# Patient Record
Sex: Female | Born: 1970 | Race: White | Hispanic: No | Smoking: Current every day smoker
Health system: Southern US, Community
[De-identification: ages and names within clinical notes are randomized; demographics above are authoritative.]

## PROBLEM LIST (undated history)

## (undated) DIAGNOSIS — F32A Depression, unspecified: Secondary | ICD-10-CM

## (undated) DIAGNOSIS — G8929 Other chronic pain: Secondary | ICD-10-CM

## (undated) DIAGNOSIS — D649 Anemia, unspecified: Secondary | ICD-10-CM

## (undated) DIAGNOSIS — T7840XA Allergy, unspecified, initial encounter: Secondary | ICD-10-CM

## (undated) DIAGNOSIS — M1990 Unspecified osteoarthritis, unspecified site: Secondary | ICD-10-CM

## (undated) DIAGNOSIS — R51 Headache: Secondary | ICD-10-CM

## (undated) DIAGNOSIS — R519 Headache, unspecified: Secondary | ICD-10-CM

## (undated) DIAGNOSIS — K219 Gastro-esophageal reflux disease without esophagitis: Secondary | ICD-10-CM

## (undated) DIAGNOSIS — F419 Anxiety disorder, unspecified: Secondary | ICD-10-CM

## (undated) HISTORY — DX: Depression, unspecified: F32.A

## (undated) HISTORY — DX: Unspecified osteoarthritis, unspecified site: M19.90

## (undated) HISTORY — DX: Allergy, unspecified, initial encounter: T78.40XA

## (undated) HISTORY — DX: Headache, unspecified: R51.9

## (undated) HISTORY — PX: BACK SURGERY: SHX140

## (undated) HISTORY — DX: Anxiety disorder, unspecified: F41.9

## (undated) HISTORY — DX: Gastro-esophageal reflux disease without esophagitis: K21.9

## (undated) HISTORY — DX: Headache: R51

## (undated) HISTORY — DX: Other chronic pain: G89.29

## (undated) HISTORY — DX: Anemia, unspecified: D64.9

---
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
## 1994-11-06 HISTORY — PX: CARPAL TUNNEL RELEASE: SHX101

## 1998-12-20 ENCOUNTER — Other Ambulatory Visit: Admission: RE | Admit: 1998-12-20 | Discharge: 1998-12-20 | Payer: Self-pay | Admitting: Obstetrics & Gynecology

## 1999-02-28 ENCOUNTER — Other Ambulatory Visit: Admission: RE | Admit: 1999-02-28 | Discharge: 1999-02-28 | Payer: Self-pay | Admitting: *Deleted

## 1999-05-04 ENCOUNTER — Emergency Department (HOSPITAL_COMMUNITY): Admission: EM | Admit: 1999-05-04 | Discharge: 1999-05-04 | Payer: Self-pay | Admitting: Emergency Medicine

## 1999-05-19 ENCOUNTER — Encounter (HOSPITAL_COMMUNITY): Admission: RE | Admit: 1999-05-19 | Discharge: 1999-06-03 | Payer: Self-pay | Admitting: Emergency Medicine

## 1999-06-06 ENCOUNTER — Other Ambulatory Visit: Admission: RE | Admit: 1999-06-06 | Discharge: 1999-06-06 | Payer: Self-pay | Admitting: *Deleted

## 1999-11-07 HISTORY — PX: MICRODISCECTOMY LUMBAR: SUR864

## 2000-09-26 ENCOUNTER — Encounter: Payer: Self-pay | Admitting: Family Medicine

## 2000-09-26 ENCOUNTER — Encounter: Admission: RE | Admit: 2000-09-26 | Discharge: 2000-09-26 | Payer: Self-pay | Admitting: Family Medicine

## 2000-10-26 ENCOUNTER — Ambulatory Visit (HOSPITAL_COMMUNITY): Admission: RE | Admit: 2000-10-26 | Discharge: 2000-10-26 | Payer: Self-pay | Admitting: Neurosurgery

## 2000-11-12 ENCOUNTER — Ambulatory Visit (HOSPITAL_COMMUNITY): Admission: RE | Admit: 2000-11-12 | Discharge: 2000-11-12 | Payer: Self-pay | Admitting: Neurosurgery

## 2000-11-26 ENCOUNTER — Ambulatory Visit (HOSPITAL_COMMUNITY): Admission: RE | Admit: 2000-11-26 | Discharge: 2000-11-26 | Payer: Self-pay | Admitting: Neurosurgery

## 2001-02-20 ENCOUNTER — Ambulatory Visit (HOSPITAL_COMMUNITY): Admission: RE | Admit: 2001-02-20 | Discharge: 2001-02-21 | Payer: Self-pay | Admitting: Neurosurgery

## 2001-04-13 ENCOUNTER — Ambulatory Visit (HOSPITAL_COMMUNITY): Admission: RE | Admit: 2001-04-13 | Discharge: 2001-04-13 | Payer: Self-pay | Admitting: Neurosurgery

## 2001-04-18 ENCOUNTER — Encounter: Admission: RE | Admit: 2001-04-18 | Discharge: 2001-05-07 | Payer: Self-pay | Admitting: Neurosurgery

## 2007-02-04 ENCOUNTER — Emergency Department (HOSPITAL_COMMUNITY): Admission: EM | Admit: 2007-02-04 | Discharge: 2007-02-04 | Payer: Self-pay | Admitting: Emergency Medicine

## 2009-09-22 ENCOUNTER — Ambulatory Visit: Payer: Self-pay | Admitting: Diagnostic Radiology

## 2009-09-22 ENCOUNTER — Emergency Department (HOSPITAL_BASED_OUTPATIENT_CLINIC_OR_DEPARTMENT_OTHER): Admission: EM | Admit: 2009-09-22 | Discharge: 2009-09-22 | Payer: Self-pay | Admitting: Emergency Medicine

## 2009-11-06 HISTORY — PX: LUMBAR FUSION: SHX111

## 2010-04-15 ENCOUNTER — Inpatient Hospital Stay (HOSPITAL_COMMUNITY): Admission: RE | Admit: 2010-04-15 | Discharge: 2010-04-17 | Payer: Self-pay | Admitting: Neurosurgery

## 2010-05-31 ENCOUNTER — Encounter: Admission: RE | Admit: 2010-05-31 | Discharge: 2010-05-31 | Payer: Self-pay | Admitting: Neurosurgery

## 2010-07-13 ENCOUNTER — Encounter: Admission: RE | Admit: 2010-07-13 | Discharge: 2010-07-13 | Payer: Self-pay | Admitting: Neurosurgery

## 2010-07-27 ENCOUNTER — Encounter
Admission: RE | Admit: 2010-07-27 | Discharge: 2010-09-05 | Payer: Self-pay | Source: Home / Self Care | Admitting: Neurosurgery

## 2011-01-23 LAB — TYPE AND SCREEN
ABO/RH(D): A POS
Antibody Screen: NEGATIVE

## 2011-01-23 LAB — CBC
Hemoglobin: 13.1 g/dL (ref 12.0–15.0)
MCHC: 33.5 g/dL (ref 30.0–36.0)
MCV: 89.3 fL (ref 78.0–100.0)
Platelets: 164 10*3/uL (ref 150–400)
WBC: 8.7 10*3/uL (ref 4.0–10.5)

## 2011-01-23 LAB — ABO/RH: ABO/RH(D): A POS

## 2011-01-23 LAB — SURGICAL PCR SCREEN: MRSA, PCR: NEGATIVE

## 2011-03-24 NOTE — H&P (Signed)
[place]  Patient:    JERICA, CREEGAN                     MRN: [id number] Adm. Date:  [id number] Disc. Date: [id number] Attending:  Tressie Stalker D                         History and Physical  CHIEF COMPLAINT: Back pain and right leg pain.  HISTORY OF PRESENT ILLNESS: The patient is a 40 year old white female who has suffered from an approximate six month history of back and right leg pain. She failed medical management and was treated with various medications, steroid injections, etc., and she failed to improve.  She therefore weighed the risks and benefits and alternatives of surgery and decided to proceed with surgery.  She complains mainly of pain which begins in her right hip and buttocks region and runs down her right leg, and some pain in her back.  She occasionally gets some left leg pain.  PAST MEDICAL HISTORY:  1. Cervical dysplasia.  2. Remote history of appendicitis.  3. Carpal tunnel syndrome.  4. Chronic sinus trouble.  PAST SURGICAL HISTORY:  1. Appendectomy in April 1993.  2. Left carpel tunnel release in January 1997.  CURRENT MEDICATIONS: Tylox p.r.n.  ALLERGIES: SULFA causes nausea.  FAMILY HISTORY: The patients mother is age 53 and in good health.  The patients father is age 62 and in good health except for a detached retina. She has a grandmother who had breast cancer and a grandfather with high blood pressure.  SOCIAL HISTORY: The patient is divorced and has a 60-1/2-year-old daughter. [place]  She is employed as a Production assistant, radio at [employer].  She smokes one pack per day of cigarettes and has for 13 years.  I highly advised her to quit.  She denies drug use.  She drinks alcohol socially.  REVIEW OF SYSTEMS: Negative except as above.  PHYSICAL EXAMINATION:  GENERAL: Pleasant, well-developed, well-nourished 40 year old white female complaining of back and right leg  pain.  VITAL SIGNS: Height 5 feet 4 inches.  Weight 135 pounds.  HEENT: Normal.  NECK: Normal.  THORAX: Symmetric.  LUNGS: Clear to auscultation.  HEART: Regular rate and rhythm.  ABDOMEN: Soft, nontender.  EXTREMITIES: She has a well-healed scar on the right calf; otherwise normal.  BACK: No point tenderness or deformities.  NEUROLOGIC: Straight leg raise testing is equivocal on the right and negative on the left.  Fabere testing is negative bilaterally.  The patient is alert and oriented x 3.  Cranial nerves 2-12 are grossly intact bilaterally.  Vision and hearing are grossly normal bilaterally.  Motor strength is 5/5 in bilateral deltoid, biceps, triceps, hand grip, wrist extensor, interosseous psoas, quadriceps, gastrocnemius, and extensor hallucis longus, although she does give away in her right extensor hallucis longus.  Sensory examination demonstrates decreased sensation in the right foot diffusely.  Deep tendon reflexes are 2/4 in her bilateral biceps, triceps, brachial radialis, quadriceps, and gastrocnemius.  She has bilateral flexor and plantar reflexes. Negative clonus.  IMAGING STUDIES: The patient had a lumbar MRI performed at [facility] on September 26, 2000 without contrast demonstrating normal lumbar lordosis.  She has degenerative disk disease at L4-5, with loss of disk space height, mild disk desiccation, as well as central and right-sided herniated nucleus pulposus causing right greater than left lateral recess stenosis.  ASSESSMENT/PLAN: L4-5 degenerative disk disease  with herniated nucleus pulposus, spinal stenosis, lumbar radiculopathy, with lumbago.  I discussed the situation with the patient and reviewed her MRI scan with her, pointing out the abnormalities.  She has failed medical management including lumbar epidural steroid injections, time, rest, medications, etc., and I therefore discussed her current options of continued medical management, doing  nothing, and surgery.  I described the procedure of L4-5 microdiskectomy and showed her surgical models, and discussed the risks of surgery extensively.  The patient has weighed the risks and benefits and alternatives of surgery and wishes to proceed with right L4-5 microdiskectomy on February 20, 2001. DD:  02/20/01 TD:  02/21/01 Job: [id number]

## 2011-03-24 NOTE — Op Note (Signed)
[place]  Patient:    Carly Wilson, Carly Wilson                     MRN: [id number] Proc. Date: 02/20/01 Adm. Date:  [id number] Disc. Date: [id number] Attending:  Tressie Stalker D                           Operative Report  PREOPERATIVE DIAGNOSIS:  L4-5 herniated nucleus pulposus, spinal stenosis, degenerative disk disease, lumbar radiculopathy, lumbago.  POSTOPERATIVE DIAGNOSIS:  L4-5 herniated nucleus pulposus, spinal stenosis, degenerative disk disease, lumbar radiculopathy, lumbago.  OPERATION PERFORMED:  L4-5 microdiskectomy using microdissection.  SURGEON:  Cristi Loron, M.D.  ASSISTANT:  Mena Goes. Franky Macho, M.D.  ANESTHESIA:  General endotracheal.  ESTIMATED BLOOD LOSS:  Less than 100 cc.  SPECIMENS:  None.  DRAINS:  None.  COMPLICATIONS:  None.  INDICATIONS FOR PROCEDURE:   The patient is a 40 year old white female who suffered from back and right leg pain.  She had failed medical management and was worked up as an outpatient with a lumbar MRI that demonstrated herniated disk at L4-5.  The patients signs, symptoms and physical exam were consistent with a right L5 radiculopathy.  She therefore weighed the risks, benefits and alternatives to surgery and decided to proceed with microdiskectomy.  DESCRIPTION OF PROCEDURE:  The patient was brought to the operating room by the anesthesia team.  General endotracheal anesthesia was induced.  The patient was then turned to the prone position on the Wilson frame.  Her lumbosacral region was then prepared with Betadine scrub and Betadine solution.  Sterile drapes were applied.  I then injected the area to be incised with Marcaine with epinephrine solution and used the scalpel to make a vertical midline incision over the L4-5 interspace.  I used electrocautery to dissect down to the thoracolumbar fascia and divide it to the right of midline performing a right-sided subperiosteal dissection,  stripping the paraspinous musculature from the spinous process and lamina of L4 and L5.  I inserted the McCullough retractor for exposure and obtained intraoperative radiograph to confirm my location.  I then brought the operative microscope into the field and under its magnification and illumination, I completed the microdissection/decompression.  I used a high speed drill to perform a right L4 laminotomy, a wanding laminotomy with the Kerrison punch, removing the right L4-5 ligamentum flavum.  I performed a foraminotomy about the right L5 nerve roots.  I then used microdissection to free up  the nerve root from the epidural tissue and gently retracted medially with the DErrico retractor. This exposed a moderate sized disk  herniation.  I incised into the intervertebral disk with a 15 blade scalpel and performed a partial intervertebral diskectomy with a pituitary forceps, Epstein and Scoville curets.  After I was satisfied with the diskectomy, I palpated along the ventral surface of the thecal sac and the L5 nerve root and noted that the neural structures were well decompressed.  I then achieved stringent hemostasis with bipolar electrocautery.  I copiously irrigated the wound with bacitracin solution and removed the solution and then removed the McCullough retractor and reapproximated the patients thoracolumbar fascia with interrupted #1 Vicryl suture.  The subcutaneous tissues were repaired with 2-0 Vicryl, skin with Steri-Strips and benzoin.  The wound was then coated with bacitracin ointment and sterile dressing was applied.  The drape was removed and the patient was subsequently returned to the supine  position where she was extubated by the anesthesia team and transported to the post anesthesia care unit in stable condition.  All sponge, needle and instrument counts were correct at the end of this case. DD:  02/20/01 TD:  02/21/01 Job: [id number]

## 2013-04-25 ENCOUNTER — Other Ambulatory Visit: Payer: Self-pay | Admitting: Physician Assistant

## 2013-04-25 DIAGNOSIS — Z1231 Encounter for screening mammogram for malignant neoplasm of breast: Secondary | ICD-10-CM

## 2013-05-06 ENCOUNTER — Ambulatory Visit
Admission: RE | Admit: 2013-05-06 | Discharge: 2013-05-06 | Disposition: A | Discharge status: Home / Self Care | Payer: BC Managed Care – PPO | Source: Ambulatory Visit | Attending: Physician Assistant | Admitting: Physician Assistant

## 2013-05-06 DIAGNOSIS — Z1231 Encounter for screening mammogram for malignant neoplasm of breast: Secondary | ICD-10-CM

## 2013-09-24 ENCOUNTER — Other Ambulatory Visit: Payer: Self-pay | Admitting: Internal Medicine

## 2013-09-24 DIAGNOSIS — R1032 Left lower quadrant pain: Secondary | ICD-10-CM

## 2013-09-24 DIAGNOSIS — R1013 Epigastric pain: Secondary | ICD-10-CM

## 2013-09-24 DIAGNOSIS — R195 Other fecal abnormalities: Secondary | ICD-10-CM

## 2013-09-26 ENCOUNTER — Other Ambulatory Visit: Payer: BC Managed Care – PPO

## 2013-09-29 ENCOUNTER — Inpatient Hospital Stay: Admission: RE | Admit: 2013-09-29 | Payer: BC Managed Care – PPO | Source: Ambulatory Visit

## 2013-09-29 ENCOUNTER — Other Ambulatory Visit: Payer: Self-pay | Admitting: Internal Medicine

## 2013-09-29 ENCOUNTER — Ambulatory Visit
Admission: RE | Admit: 2013-09-29 | Discharge: 2013-09-29 | Disposition: A | Discharge status: Home / Self Care | Payer: BC Managed Care – PPO | Source: Ambulatory Visit | Attending: Internal Medicine | Admitting: Internal Medicine

## 2013-09-29 DIAGNOSIS — R1013 Epigastric pain: Secondary | ICD-10-CM

## 2013-09-29 DIAGNOSIS — R195 Other fecal abnormalities: Secondary | ICD-10-CM

## 2013-09-29 DIAGNOSIS — R1032 Left lower quadrant pain: Secondary | ICD-10-CM

## 2013-09-29 MED ORDER — IOHEXOL 300 MG/ML  SOLN
100.0000 mL | Freq: Once | INTRAMUSCULAR | Status: AC | PRN
  Administered 2013-09-29: 100 mL via INTRAVENOUS

## 2014-05-25 ENCOUNTER — Encounter: Payer: Self-pay | Admitting: Internal Medicine

## 2014-05-25 ENCOUNTER — Ambulatory Visit (INDEPENDENT_AMBULATORY_CARE_PROVIDER_SITE_OTHER): Payer: Managed Care, Other (non HMO) | Admitting: Internal Medicine

## 2014-05-25 VITALS — BP 120/70 | HR 81 | Temp 98.6°F | Ht 64.5 in | Wt 148.2 lb

## 2014-05-25 DIAGNOSIS — R058 Other specified cough: Secondary | ICD-10-CM | POA: Insufficient documentation

## 2014-05-25 DIAGNOSIS — R05 Cough: Secondary | ICD-10-CM

## 2014-05-25 DIAGNOSIS — F17200 Nicotine dependence, unspecified, uncomplicated: Secondary | ICD-10-CM

## 2014-05-25 DIAGNOSIS — R059 Cough, unspecified: Secondary | ICD-10-CM

## 2014-05-25 MED ORDER — TRAMADOL HCL 50 MG PO TABS: ORAL_TABLET | ORAL | Status: DC

## 2014-05-25 MED ORDER — PROMETHAZINE HCL 12.5 MG PO TABS: 12.5000 mg | ORAL_TABLET | Freq: Four times a day (QID) | ORAL | Status: DC | PRN

## 2014-05-25 MED ORDER — FAMOTIDINE 20 MG PO TABS: ORAL_TABLET | ORAL | Status: DC

## 2014-05-25 MED ORDER — MOMETASONE FURO-FORMOTEROL FUM 100-5 MCG/ACT IN AERO: INHALATION_SPRAY | RESPIRATORY_TRACT | Status: DC

## 2014-05-25 MED ORDER — PANTOPRAZOLE SODIUM 40 MG PO TBEC: 40.0000 mg | DELAYED_RELEASE_TABLET | Freq: Every day | ORAL | Status: DC

## 2014-05-25 NOTE — Progress Notes (Addendum)
Subjective:    Patient ID: Carly Wilson, female    DOB: 15-Nov-1970,  MRN: [id number]  HPI  [age] yowf active smoker healthy as child then in HS started having bad bronchitis x maybe once a year an no chronic resp issue and no need for maint inhaler (did use some saba prn during flares) then changed around 2012 more freq coughing attributed to GERD / sinus dz rx nexium, proair no better so referred 05/25/2014 to pulmonary clinic 05/25/2014 by Carly CoupeAmy Bucherle with recurrent  Sustained painful cough since Oct 2014 .   05/25/2014 1st [facility] office visit/ Carly Wilson  Chief Complaint  Patient presents with  . Pulmonary Consult    Referred per Dr. Talbert ForestAmy Wilson.  Pt c/o prod cough x 1 yr- worse since Oct 2014.  Cough is prod with moderate yellow sputum. She also c/o SOB "with alot of walking".  She started noticing pain under her ribs in March 2015.    cough daily x Occt 2014,   worse first thing in am hacking as soon as rises > yellow mucus,  pred worked the best but relapsed - no resp to augmentin proair seems to help for 6349m only  Nexium 20 x 2 with breakfast Cp is generalized lower chest wall bilaterally but tends to migrate to different locations depending on how hard she coughs   No obvious patterns in day to day or daytime variabilty or sob (unless coughing) chest tightness, subjective wheeze overt sinus  symptoms. No unusual exp hx or h/o childhood pna/ asthma or knowledge of premature birth.  Sleeping ok without nocturnal   exacerbation  of respiratory  c/o's or need for noct saba. Also denies any obvious fluctuation of symptoms with weather or environmental changes or other aggravating or alleviating factors except as outlined above   Current Medications, Allergies, Complete Past Medical History, Past Surgical History, Family History, and Social History were reviewed in [facility] electronic medical record.             Review of Systems  Constitutional: Negative for fever,  chills and unexpected weight change.  HENT: Positive for sore throat and trouble swallowing. Negative for congestion, dental problem, ear pain, nosebleeds, postnasal drip, rhinorrhea, sinus pressure, sneezing and voice change.   Eyes: Negative for visual disturbance.  Respiratory: Positive for cough and shortness of breath. Negative for choking.   Cardiovascular: Positive for chest pain. Negative for leg swelling.  Gastrointestinal: Positive for abdominal pain. Negative for vomiting and diarrhea.  Genitourinary: Negative for difficulty urinating.       Acid heartburn  Indigestion   Musculoskeletal: Positive for arthralgias.  Skin: Negative for rash.  Neurological: Positive for headaches. Negative for tremors and syncope.  Hematological: Does not bruise/bleed easily.       Objective:   Physical Exam  amb wf nad   Wt Readings from Last 3 Encounters:  05/25/14 148 lb 3.2 oz (67.223 kg)      HEENT: nl dentition, turbinates, and orophanx. Nl external ear canals without cough reflex   NECK :  without JVD/Nodes/TM/ nl carotid upstrokes bilaterally   LUNGS: no acc muscle use, clear to A and P bilaterally without cough on insp or exp maneuvers   CV:  RRR  no s3 or murmur or increase in P2, no edema   ABD:  soft and nontender with nl excursion in the supine position. No bruits or organomegaly, bowel sounds nl  MS:  warm without deformities, calf tenderness, cyanosis or clubbing  SKIN: warm and dry without lesions    NEURO:  alert, approp, no deficits     cxr 03/13/14 report/ Nl cxr      Assessment & Plan:

## 2014-05-25 NOTE — Assessment & Plan Note (Addendum)
The most common causes of chronic cough in immunocompetent adults include the following: upper airway cough syndrome (UACS), previously referred to as postnasal drip syndrome (PNDS), which is caused by variety of rhinosinus conditions; (2) asthma; (3) GERD; (4) chronic bronchitis from cigarette smoking or other inhaled environmental irritants; (5) nonasthmatic eosinophilic bronchitis; and (6) bronchiectasis.   These conditions, singly or in combination, have accounted for up to 94% of the causes of chronic cough in prospective studies.   Other conditions have constituted no >6% of the causes in prospective studies These have included bronchogenic carcinoma, chronic interstitial pneumonia, sarcoidosis, left ventricular failure, ACEI-induced cough, and aspiration from a condition associated with pharyngeal dysfunction.    Chronic cough is often simultaneously caused by more than one condition. A single cause has been found from 38 to 82% of the time, multiple causes from 18 to 62%. Multiply caused cough has been the result of three diseases up to 42% of the time.       Based on hx and exam, this is most likely:  Either cough variant asthma or more likely Classic Upper airway cough syndrome, so named because it's frequently impossible to sort out how much is  CR/sinusitis with freq throat clearing (which can be related to primary GERD)   vs  causing  secondary (" extra esophageal")  GERD from wide swings in gastric pressure that occur with throat clearing, often  promoting self use of mint and menthol lozenges that reduce the lower esophageal sphincter tone and exacerbate the problem further in a cyclical fashion.   These are the same pts (now being labeled as having "irritable larynx syndrome" by some cough centers) who not infrequently have a history of having failed to tolerate ace inhibitors,  dry powder inhalers or biphosphonates or report having atypical reflux symptoms that don't respond to standard  doses of PPI , and are easily confused as having aecopd or asthma flares by even experienced allergists/ pulmonologists.   The first step is to maximize acid suppression/eliminate cyclical cough and mscp from coughing with tramadol  and treat for asthma with dulera 100 2bid and f/u in 2 weeks  See instructions for specific recommendations which were reviewed directly with the patient who was given a copy with highlighter outlining the key components.

## 2014-05-25 NOTE — Patient Instructions (Addendum)
Pantoprazole (protonix) 40 mg (nexium 20 x2)  Take 30-60 min before first meal of the day and Pepcid 20 mg one bedtime until return to office - this is the best way to tell whether stomach acid is contributing to your problem.    Dulera 100 Take 2 puffs first thing in am and then another 2 puffs about 12 hours later.   GERD (REFLUX)  is an extremely common cause of respiratory symptoms, many times with no significant heartburn at all.    It can be treated with medication, but also with lifestyle changes including avoidance of late meals, excessive alcohol, smoking cessation, and avoid fatty foods, chocolate, peppermint, colas, red wine, and acidic juices such as orange juice.  NO MINT OR MENTHOL PRODUCTS SO NO COUGH DROPS  USE SUGARLESS CANDY INSTEAD (jolley ranchers or Stover's)  NO OIL BASED VITAMINS - use powdered substitutes.  For cough mucinex dm 1200 mg every 12 hours or tramadol 50 mg one- two every 4 hours plus promethazine for nausea 12.5 mg as needed   Please schedule a follow up office visit in 2 weeks, sooner if needed

## 2014-05-25 NOTE — Assessment & Plan Note (Addendum)
>   3 min discussion  I emphasized that although we never turn away smokers from the pulmonary clinic, we do ask that they understand that the recommendations that we make  won't work nearly as well in the presence of continued cigarette exposure.  In fact, we may very well  reach a point where we can't promise to help the patient if  she can't quit smoking. (We can and will promise to try to help, we just can't promise what we recommend will really work)  

## 2014-06-08 ENCOUNTER — Encounter: Payer: Self-pay | Admitting: Internal Medicine

## 2014-06-08 ENCOUNTER — Ambulatory Visit (INDEPENDENT_AMBULATORY_CARE_PROVIDER_SITE_OTHER): Payer: Managed Care, Other (non HMO) | Admitting: Internal Medicine

## 2014-06-08 VITALS — BP 110/80 | HR 70 | Temp 97.8°F | Ht 64.5 in | Wt 144.0 lb

## 2014-06-08 DIAGNOSIS — R05 Cough: Secondary | ICD-10-CM

## 2014-06-08 DIAGNOSIS — R058 Other specified cough: Secondary | ICD-10-CM

## 2014-06-08 DIAGNOSIS — R059 Cough, unspecified: Secondary | ICD-10-CM

## 2014-06-08 DIAGNOSIS — F17200 Nicotine dependence, unspecified, uncomplicated: Secondary | ICD-10-CM

## 2014-06-08 MED ORDER — PROMETHAZINE HCL 12.5 MG PO TABS: 12.5000 mg | ORAL_TABLET | Freq: Four times a day (QID) | ORAL | Status: DC | PRN

## 2014-06-08 MED ORDER — TRAMADOL HCL 50 MG PO TABS: ORAL_TABLET | ORAL | Status: DC

## 2014-06-08 MED ORDER — PREDNISONE 10 MG PO TABS: ORAL_TABLET | ORAL | Status: DC

## 2014-06-08 NOTE — Patient Instructions (Addendum)
Prednisone 10 mg take  4 each am x 2 days,   2 each am x 2 days,  1 each am x 2 days and stop   For cough> Take  mucinex dm 1200 every 12 hours and supplement if needed with  tramadol 50 mg up to 2 every 4 hours to suppress the urge to cough. Swallowing water or using ice chips/non mint and menthol containing candies (such as lifesavers or sugarless jolly ranchers) are also effective.  You should rest your voice and avoid activities that you know make you cough.  Once you have eliminated the cough for 3 straight days try reducing the tramadol first,  then the mucinex dm as tolerated.    Continue dulera 100 Take 2 puffs first thing in am and then another 2 puffs about 12 hours later.   The key is to stop smoking completely before smoking completely stops you!   Please schedule a follow up office visit in 4 weeks, sooner if needed

## 2014-06-08 NOTE — Progress Notes (Signed)
Subjective:    Patient ID: Carly Wilson, female    DOB: 1970-11-08,  MRN: [id number]    Brief patient profile:  [age] yowf active smoker healthy as child then in HS started having bad bronchitis x maybe once a year an no chronic resp issue and no need for maint inhaler (did use some saba prn during flares) then changed around 2012 more freq coughing attributed to GERD / sinus dz rx nexium, proair no better so referred 05/25/2014 to pulmonary clinic 05/25/2014 by Carly CoupeAmy Wilson with recurrent  Sustained painful cough since Oct 2014 .   History of Present Illness  05/25/2014 1st [facility] office visit/ Carly Wilson  Chief Complaint  Patient presents with  . Pulmonary Consult    Referred per Dr. Talbert ForestAmy Burchette.  Pt c/o prod cough x 1 yr- worse since Oct 2014.  Cough is prod with moderate yellow sputum. She also c/o SOB "with alot of walking".  She started noticing pain under her ribs in March 2015.    cough daily x Oct 2014,   worse first thing in am hacking as soon as rises > yellow mucus,  pred worked the best but relapsed - no resp to augmentin proair seems to help for 7778m only  Nexium 20 x 2 with breakfast Cp is generalized lower chest wall bilaterally but tends to migrate to different locations depending on how hard she coughs rec Pantoprazole (protonix) 40 mg (nexium 20 x2)  Take 30-60 min before first meal of the day and Pepcid 20 mg one bedtime until return to office - this is the best way to tell whether stomach acid is contributing to your problem.   Dulera 100 Take 2 puffs first thing in am and then another 2 puffs about 12 hours later.  GERD diet. For cough mucinex dm 1200 mg every 12 hours or tramadol 50 mg one- two every 4 hours plus promethazine for nausea 12.5 mg as needed      06/08/2014 f/u ov/Carly Wilson re: chronic cough / still smoking  Chief Complaint  Patient presents with  . Follow-up    Pt states that her cough has slightly improved.  Still coughing up c/o minimal to  moderate yellow sputum.    never took tramadol in adequate doses to eliminate cyclical cough   No obvious day to day or daytime variabilty or assoc sob or cp or chest tightness, subjective wheeze overt sinus or hb symptoms. No unusual exp hx or h/o childhood pna/ asthma or knowledge of premature birth.  Sleeping ok without nocturnal  or early am exacerbation  of respiratory  c/o's or need for noct saba. Also denies any obvious fluctuation of symptoms with weather or environmental changes or other aggravating or alleviating factors except as outlined above   Current Medications, Allergies, Complete Past Medical History, Past Surgical History, Family History, and Social History were reviewed in [facility] electronic medical record.  ROS  The following are not active complaints unless bolded sore throat, dysphagia, dental problems, itching, sneezing,  nasal congestion or excess/ purulent secretions, ear ache,   fever, chills, sweats, unintended wt loss, pleuritic or exertional cp, hemoptysis,  orthopnea pnd or leg swelling, presyncope, palpitations, heartburn, abdominal pain, anorexia, nausea, vomiting, diarrhea  or change in bowel or urinary habits, change in stools or urine, dysuria,hematuria,  rash, arthralgias, visual complaints, headache, numbness weakness or ataxia or problems with walking or coordination,  change in mood/affect or memory.  Objective:   Physical Exam  amb wf nad   Wt Readings from Last 3 Encounters:  06/08/14 144 lb (65.318 kg)  05/25/14 148 lb 3.2 oz (67.223 kg)         HEENT: nl dentition, turbinates, and orophanx. Nl external ear canals without cough reflex   NECK :  without JVD/Nodes/TM/ nl carotid upstrokes bilaterally   LUNGS: no acc muscle use, clear to A and P bilaterally without cough on insp or exp maneuvers   CV:  RRR  no s3 or murmur or increase in P2, no edema   ABD:  soft and nontender with nl excursion in the  supine position. No bruits or organomegaly, bowel sounds nl  MS:  warm without deformities, calf tenderness, cyanosis or clubbing  SKIN: warm and dry without lesions    NEURO:  alert, approp, no deficits     cxr 03/13/14 report/ Nl cxr      Assessment & Plan:

## 2014-06-09 NOTE — Assessment & Plan Note (Signed)
I took an extended  opportunity with this patient to outline the consequences of continued cigarette use  in airway disorders based on all the data we have from the multiple national lung health studies (perfomed over decades at millions of dollars in cost)  indicating that smoking cessation, not choice of inhalers or physicians, is the most important aspect of care.   

## 2014-06-09 NOTE — Assessment & Plan Note (Signed)
The standardized cough guidelines published in Chest by Stark Fallsichard Irwin in 2006 are still the best available and consist of a multiple step process (up to 12!) , not a single office visit,  and are intended  to address this problem logically,  with an alogrithm dependent on response to empiric treatment at  each progressive step  to determine a specific diagnosis with  minimal addtional testing needed. Therefore if adherence is an issue or can't be accurately verified,  it's very unlikely the standard evaluation and treatment will be successful here.    Furthermore, response to therapy (other than acute cough suppression, which should only be used short term with avoidance of narcotic containing cough syrups if possible), can be a gradual process for which the patient may perceive immediate benefit.  Need to rechallenge with short course high dose tramadol to eliminate cyclical cough then regroup  Not clear re whether also has asthma so will rx with another 4 weeks samples.

## 2014-07-03 ENCOUNTER — Telehealth: Payer: Self-pay | Admitting: Internal Medicine

## 2014-07-03 DIAGNOSIS — R058 Other specified cough: Secondary | ICD-10-CM

## 2014-07-03 DIAGNOSIS — R05 Cough: Secondary | ICD-10-CM

## 2014-07-03 NOTE — Telephone Encounter (Signed)
Spoke with the pt and notified of recs per MW  She verbalized understanding  Nothing further needed 

## 2014-07-03 NOTE — Telephone Encounter (Signed)
Called, spoke with pt -  Reports cough is only slightly improved from last OV with Dr. Sherene Sires.  States she still coughs "a lot."  Finished the prednisone.  Is taking mucniex dm 1200 mg bid, tramadol usually once daily, and using proair bid on average with short term relief.  States she was given a hydrocodone cough syrup in [place] in the past which helped.  Requesting rx -- offered pt an appt.  She is leaving in a week to go out of town x 1 wk and is unable to come into the office this wk for appt.  Dr. Sherene Sires, pls advise.  Thank you.

## 2014-07-03 NOTE — Telephone Encounter (Signed)
Can't call in hydrocodone. The tramadol was not to be used once a day but up to every 4 hours and is more effective than hydrocodone in most patients.   Nothing else to offer over the phone

## 2014-07-06 ENCOUNTER — Ambulatory Visit: Payer: Managed Care, Other (non HMO) | Admitting: Internal Medicine

## 2014-07-31 ENCOUNTER — Ambulatory Visit: Payer: Managed Care, Other (non HMO) | Admitting: Internal Medicine

## 2014-08-06 ENCOUNTER — Ambulatory Visit (INDEPENDENT_AMBULATORY_CARE_PROVIDER_SITE_OTHER): Payer: Managed Care, Other (non HMO) | Admitting: Internal Medicine

## 2014-08-06 ENCOUNTER — Encounter: Payer: Self-pay | Admitting: Internal Medicine

## 2014-08-06 VITALS — BP 118/70 | HR 90 | Temp 98.4°F | Ht 64.5 in | Wt 148.0 lb

## 2014-08-06 DIAGNOSIS — F17200 Nicotine dependence, unspecified, uncomplicated: Secondary | ICD-10-CM

## 2014-08-06 DIAGNOSIS — Z720 Tobacco use: Secondary | ICD-10-CM

## 2014-08-06 DIAGNOSIS — R058 Other specified cough: Secondary | ICD-10-CM

## 2014-08-06 DIAGNOSIS — R05 Cough: Secondary | ICD-10-CM

## 2014-08-06 MED ORDER — MOMETASONE FURO-FORMOTEROL FUM 100-5 MCG/ACT IN AERO: INHALATION_SPRAY | RESPIRATORY_TRACT | Status: DC

## 2014-08-06 NOTE — Progress Notes (Signed)
Subjective:    Patient ID: Kennis Carina, female    DOB: Jan 01, 1971,  MRN: [id number]    Brief patient profile:  62 yowf active smoker healthy as child then in HS started having bad bronchitis x maybe once a year an no chronic resp issue and no need for maint inhaler (did use some saba prn during flares) then changed around 2012 more freq coughing attributed to GERD / sinus dz rx nexium, proair no better so referred 05/25/2014 to pulmonary clinic 05/25/2014 by Augustin Coupe with recurrent  Sustained painful cough since Oct 2014 .   History of Present Illness  05/25/2014 1st [facility] office visit/ Ozell Ferrera  Chief Complaint  Patient presents with  . Pulmonary Consult    Referred per Dr. Talbert Forest.  Pt c/o prod cough x 1 yr- worse since Oct 2014.  Cough is prod with moderate yellow sputum. She also c/o SOB "with alot of walking".  She started noticing pain under her ribs in March 2015.    cough daily x Oct 2014,   worse first thing in am hacking as soon as rises > yellow mucus,  pred worked the best but relapsed - no resp to augmentin proair seems to help for 71m only  Nexium 20 x 2 with breakfast Cp is generalized lower chest wall bilaterally but tends to migrate to different locations depending on how hard she coughs rec Pantoprazole (protonix) 40 mg (nexium 20 x2)  Take 30-60 min before first meal of the day and Pepcid 20 mg one bedtime until return to office - this is the best way to tell whether stomach acid is contributing to your problem.   Dulera 100 Take 2 puffs first thing in am and then another 2 puffs about 12 hours later.  GERD diet. For cough mucinex dm 1200 mg every 12 hours or tramadol 50 mg one- two every 4 hours plus promethazine for nausea 12.5 mg as needed      06/08/2014 f/u ov/Charli Halle re: chronic cough / still smoking  Chief Complaint  Patient presents with  . Follow-up    Pt states that her cough has slightly improved.  Still coughing up c/o minimal to  moderate yellow sputum.    never took tramadol in adequate doses to eliminate cyclical cough  rec Prednisone 10 mg take  4 each am x 2 days,   2 each am x 2 days,  1 each am x 2 days and stop  For cough> Take  mucinex dm 1200 every 12 hours and supplement if needed with  tramadol 50 mg up to 2 every 4 hours  Continue dulera 100 Take 2 puffs first thing in am and then another 2 puffs about 12 hours later.  The key is to stop smoking completely before smoking completely stops you!    08/06/2014 f/u ov/Andersson Larrabee re: still smoking/ chronic bronchitis   Chief Complaint  Patient presents with  . Follow-up    Pt states that her cough has slightly improved. She c/o knot in between her breasts- painful to touch and she started to notice this 2 wks ago.      Some better until dulera ran out in terms of cough and sob  Cough is variable worse in am's, no purulent sputum  No obvious patterns in  day to day or daytime variabilty or assoc sob or cp or chest tightness, subjective wheeze overt sinus or hb symptoms. No unusual exp hx or h/o childhood pna/ asthma or knowledge  of premature birth.  Sleeping ok without nocturnal  or early am exacerbation  of respiratory  c/o's or need for noct saba. Also denies any obvious fluctuation of symptoms with weather or environmental changes or other aggravating or alleviating factors except as outlined above   Current Medications, Allergies, Complete Past Medical History, Past Surgical History, Family History, and Social History were reviewed in [facility] electronic medical record.  ROS  The following are not active complaints unless bolded sore throat, dysphagia, dental problems, itching, sneezing,  nasal congestion or excess/ purulent secretions, ear ache,   fever, chills, sweats, unintended wt loss, pleuritic or exertional cp, hemoptysis,  orthopnea pnd or leg swelling, presyncope, palpitations, heartburn, abdominal pain, anorexia, nausea, vomiting, diarrhea  or  change in bowel or urinary habits, change in stools or urine, dysuria,hematuria,  rash, arthralgias, visual complaints, headache, numbness weakness or ataxia or problems with walking or coordination,  change in mood/affect or memory.                        Objective:   Physical Exam  amb wf nad   08/06/2014        148  Wt Readings from Last 3 Encounters:  06/08/14 144 lb (65.318 kg)  05/25/14 148 lb 3.2 oz (67.223 kg)         HEENT: nl dentition, turbinates, and orophanx. Nl external ear canals without cough reflex   NECK :  without JVD/Nodes/TM/ nl carotid upstrokes bilaterally   LUNGS: no acc muscle use, clear to A and P bilaterally without cough on insp or exp maneuvers "chest knot" is xyphoid process    CV:  RRR  no s3 or murmur or increase in P2, no edema   ABD:  soft and nontender with nl excursion in the supine position. No bruits or organomegaly, bowel sounds nl  MS:  warm without deformities, calf tenderness, cyanosis or clubbing  SKIN: warm and dry without lesions    NEURO:  alert, approp, no deficits     cxr 03/13/14 report/ Nl cxr      Assessment & Plan:

## 2014-08-06 NOTE — Patient Instructions (Addendum)
Continue dulera 100 Take 2 puffs first thing in am and then another 2 puffs about 12 hours later if you feel it helps your cough  As needed mucinex dm 1200 mg every 12 hours as needed  Your chest bone is called xiphoid process and appears normal to me  It is  most likely the cause of your symptoms is your smoking -  The key is to stop smoking completely before smoking completely stops you!   Please schedule a follow up office visit in 6 weeks, call sooner if needed with pfts on return

## 2014-08-07 NOTE — Assessment & Plan Note (Signed)
I had an extended discussion with the patient today lasting 15 to 20 minutes of a 25 minute visit on the following issues:   Unclear whether this is just all chronic bronchitis or uacs or an element of CB from smoking but regardless I don't think it makes any sense to pursue the usual cough w/u in an active smoker with productive cough   rec stop smoking and use mucinex dm for cough, dulera 100 for breathing and return for pfts

## 2014-08-07 NOTE — Assessment & Plan Note (Signed)
>   3 min discussion  I emphasized that although we never turn away smokers from the pulmonary clinic, we do ask that they understand that the recommendations that we make  won't work nearly as well in the presence of continued cigarette exposure.  In fact, we may very well  reach a point where we can't promise to help the patient if  she can't quit smoking. (We can and will promise to try to help, we just can't promise what we recommend will really work)  

## 2014-09-18 ENCOUNTER — Ambulatory Visit: Payer: Managed Care, Other (non HMO) | Admitting: Internal Medicine

## 2014-10-09 ENCOUNTER — Other Ambulatory Visit: Payer: Self-pay | Admitting: Internal Medicine

## 2014-10-09 DIAGNOSIS — R0600 Dyspnea, unspecified: Secondary | ICD-10-CM

## 2014-10-12 ENCOUNTER — Ambulatory Visit: Payer: Managed Care, Other (non HMO) | Admitting: Internal Medicine

## 2014-11-02 ENCOUNTER — Ambulatory Visit: Payer: Managed Care, Other (non HMO) | Admitting: Internal Medicine

## 2015-02-22 ENCOUNTER — Other Ambulatory Visit: Payer: Self-pay | Admitting: Internal Medicine

## 2017-03-01 ENCOUNTER — Other Ambulatory Visit: Payer: Self-pay | Admitting: Family Medicine

## 2017-03-01 DIAGNOSIS — Z1231 Encounter for screening mammogram for malignant neoplasm of breast: Secondary | ICD-10-CM

## 2017-03-20 ENCOUNTER — Ambulatory Visit
Admission: RE | Admit: 2017-03-20 | Discharge: 2017-03-20 | Disposition: A | Discharge status: Home / Self Care | Payer: Managed Care, Other (non HMO) | Source: Ambulatory Visit | Attending: Family Medicine | Admitting: Family Medicine

## 2017-03-20 DIAGNOSIS — Z1231 Encounter for screening mammogram for malignant neoplasm of breast: Secondary | ICD-10-CM

## 2017-03-22 ENCOUNTER — Other Ambulatory Visit: Payer: Self-pay | Admitting: Family Medicine

## 2017-03-22 DIAGNOSIS — R928 Other abnormal and inconclusive findings on diagnostic imaging of breast: Secondary | ICD-10-CM

## 2017-03-26 ENCOUNTER — Other Ambulatory Visit: Payer: Self-pay | Admitting: Family Medicine

## 2017-03-26 ENCOUNTER — Ambulatory Visit
Admission: RE | Admit: 2017-03-26 | Discharge: 2017-03-26 | Disposition: A | Discharge status: Home / Self Care | Payer: Managed Care, Other (non HMO) | Source: Ambulatory Visit | Attending: Family Medicine | Admitting: Family Medicine

## 2017-03-26 DIAGNOSIS — R928 Other abnormal and inconclusive findings on diagnostic imaging of breast: Secondary | ICD-10-CM

## 2017-03-26 DIAGNOSIS — N6320 Unspecified lump in the left breast, unspecified quadrant: Secondary | ICD-10-CM

## 2017-03-29 ENCOUNTER — Ambulatory Visit
Admission: RE | Admit: 2017-03-29 | Discharge: 2017-03-29 | Disposition: A | Discharge status: Home / Self Care | Payer: Managed Care, Other (non HMO) | Source: Ambulatory Visit | Attending: Family Medicine | Admitting: Family Medicine

## 2017-03-29 DIAGNOSIS — N6320 Unspecified lump in the left breast, unspecified quadrant: Secondary | ICD-10-CM

## 2021-08-22 ENCOUNTER — Encounter: Payer: Self-pay | Admitting: Family Medicine

## 2021-08-22 ENCOUNTER — Other Ambulatory Visit: Payer: Self-pay

## 2021-08-22 ENCOUNTER — Ambulatory Visit (INDEPENDENT_AMBULATORY_CARE_PROVIDER_SITE_OTHER): Payer: No Typology Code available for payment source | Admitting: Family Medicine

## 2021-08-22 VITALS — BP 116/64 | HR 89 | Temp 98.3°F | Resp 16 | Ht 64.0 in | Wt 179.2 lb

## 2021-08-22 DIAGNOSIS — Z789 Other specified health status: Secondary | ICD-10-CM

## 2021-08-22 DIAGNOSIS — F418 Other specified anxiety disorders: Secondary | ICD-10-CM

## 2021-08-22 DIAGNOSIS — G894 Chronic pain syndrome: Secondary | ICD-10-CM

## 2021-08-22 DIAGNOSIS — Z124 Encounter for screening for malignant neoplasm of cervix: Secondary | ICD-10-CM

## 2021-08-22 DIAGNOSIS — M26629 Arthralgia of temporomandibular joint, unspecified side: Secondary | ICD-10-CM

## 2021-08-22 DIAGNOSIS — R058 Other specified cough: Secondary | ICD-10-CM | POA: Diagnosis not present

## 2021-08-22 DIAGNOSIS — F17210 Nicotine dependence, cigarettes, uncomplicated: Secondary | ICD-10-CM

## 2021-08-22 DIAGNOSIS — K219 Gastro-esophageal reflux disease without esophagitis: Secondary | ICD-10-CM | POA: Diagnosis not present

## 2021-08-22 DIAGNOSIS — M654 Radial styloid tenosynovitis [de Quervain]: Secondary | ICD-10-CM

## 2021-08-22 DIAGNOSIS — M25532 Pain in left wrist: Secondary | ICD-10-CM

## 2021-08-22 MED ORDER — FAMOTIDINE 20 MG PO TABS: 20.0000 mg | ORAL_TABLET | Freq: Every day | ORAL | 3 refills | Status: DC

## 2021-08-22 MED ORDER — DULOXETINE HCL 30 MG PO CPEP: 30.0000 mg | ORAL_CAPSULE | Freq: Every day | ORAL | 5 refills | Status: DC

## 2021-08-22 NOTE — Progress Notes (Signed)
Subjective:  Patient ID: Carly Wilson, female    DOB: [birth year]  Age: 50 y.o. MRN: [id number]  CC:  Chief Complaint  Patient presents with   New Patient (Initial Visit)    Pt here due to changes in her insurance as well as feeling as though her care was lacking in some aspects pt is requesting rx refill pepcid to help with cost    Otalgia    Pt does note new pain in ear and jaw starting about 5 days ago with ringing in the ear denies drainage    Referral    Pt would like a referral to GYN to have her womens health checks     HPI Carly Wilson presents for  New patient to establish care with concerns above  Upper airway cough syndrome Has treated with Pepcid and requests refill. Pepcid 20mg  qd mid day.  Has helped along with reflux.  Rare spicy food.  Alcohol - 12-42 cans per week. Social event every few weeks - 8 drinks per episode. Many nights not drinking at al. No eye opener.  Smoker - 1.5ppd.   Ear pain Noticed soreness in left side of neck, sore to swallow, then moved up to jaw, then in front of ear. Main soreness in front of ear.  some associated tinnitus, high pitch - for years, more frequent in left ear past week. No hx TMJ syndrome. No new visual symptoms.   Gynecology referral Requests referral for ongoing gynecologic care.  Depression, anxiety, chronic pain.  Treated with cymbalta 30mg  qd. Prior 60mg  - weaned down to 30mg . Works ok. Better than before, still some depression. Not drinking more if depressed. Too many side effects on 60mg  - electrical sensation at times. Prior side effects on venlafaxine.  No recent psychiatrist (in 20's). No recent therapist.  Rare xanax - 1/4-1/2 for panic attacks. 3-4 times per month, none in past month.  Tramadol only as needed few times per month - has to take phenergan with it.  Tylenol and ibuprofen daily, chronic pain in all joints. Prior surgeries.  Thumb pain on left, past few years. Ordered brace.  Voltaren cream.  Rib pains at times. Prior carpal tunnel surgery on left hand.  One visit with pain mgt prior to insurance change.  No hx of PUD.   Controlled substance database (PDMP) reviewed. No concerns appreciated. Last filled alprazolam and tramadol in 01/17/21.      Depression screen PHQ 2/9 08/22/2021  Decreased Interest 1  Down, Depressed, Hopeless 1  PHQ - 2 Score 2  Altered sleeping 3  Tired, decreased energy 2  Change in appetite 0  Feeling bad or failure about yourself  2  Trouble concentrating 1  Moving slowly or fidgety/restless 0  Suicidal thoughts 0  PHQ-9 Score 10       History Patient Active Problem List   Diagnosis Date Noted   Upper airway cough syndrome vs cough variant asthma 05/25/2014   Smoker 05/25/2014   Past Medical History:  Diagnosis Date   Allergy    Anemia    Anxiety    Arthritis    Chronic headaches    Depression    GERD (gastroesophageal reflux disease)    Past Surgical History:  Procedure Laterality Date   APPENDECTOMY  11/06/1988   BACK SURGERY  2001 and 2011   BACK SURGERY     Allergies  Allergen Reactions   Sulfa Antibiotics     Nausea    Vicodin [Hydrocodone-Acetaminophen]  nausea   Prior to Admission medications   Medication Sig Start Date End Date Taking? Authorizing Provider  acetaminophen (TYLENOL) 650 MG CR tablet Take 2 tablets by mouth daily.   Yes [provider]  ALPRAZolam Prudy Feeler) 1 MG tablet Take 1 tablet by mouth daily. 01/17/21  Yes [provider]  DULoxetine (CYMBALTA) 30 MG capsule Take 30 mg by mouth daily. 07/13/21  Yes [provider]  famotidine (PEPCID) 20 MG tablet TAKE ONE TABLET BY MOUTH AT BEDTIME 02/22/15  Yes Nyoka Cowden, MD  promethazine (PHENERGAN) 25 MG tablet Take 1 tablet by mouth every 6 (six) hours as needed. 06/20/19  Yes [provider]  traMADol Janean Sark) 50 MG tablet 1-2 every 4 hours as needed for cough or pain 06/08/14  Yes Nyoka Cowden, MD   Social  History   Socioeconomic History   Marital status: Divorced    Spouse name: Not on file   Number of children: Not on file   Years of education: Not on file   Highest education level: Not on file  Occupational History   Not on file  Tobacco Use   Smoking status: Every Day    Packs/day: 1.00    Years: 26.00    Pack years: 26.00    Types: Cigarettes   Smokeless tobacco: Never  Substance and Sexual Activity   Alcohol use: Yes    Comment: 12-42 beers a week   Drug use: Yes    Types: Marijuana   Sexual activity: Yes    Birth control/protection: None  Other Topics Concern   Not on file  Social History Narrative   Not on file   Social Determinants of Health   Financial Resource Strain: Not on file  Food Insecurity: Not on file  Transportation Needs: Not on file  Physical Activity: Not on file  Stress: Not on file  Social Connections: Not on file  Intimate Partner Violence: Not on file    Review of Systems Per HPI.   Objective:   Vitals:   08/22/21 1518  BP: 116/64  Pulse: 89  Resp: 16  Temp: 98.3 F (36.8 C)  TempSrc: Temporal  SpO2: 95%  Weight: 179 lb 3.2 oz (81.3 kg)  Height:  (1.626 m)     Physical Exam Vitals reviewed.  Constitutional:      Appearance: Normal appearance. She is well-developed.  HENT:     Head: Normocephalic and atraumatic.     Left Ear: Tympanic membrane, ear canal and external ear normal.  Eyes:     Conjunctiva/sclera: Conjunctivae normal.     Pupils: Pupils are equal, round, and reactive to light.  Neck:     Vascular: No carotid bruit.     Comments: No bruit, no lymphadenopathy, very minimal discomfort near the carotid but reports symptoms have improved.  No swelling appreciated, skin intact, no erythema.  Tender to palpation over left TMJ   Cardiovascular:     Rate and Rhythm: Normal rate and regular rhythm.     Heart sounds: Normal heart sounds.  Pulmonary:     Effort: Pulmonary effort is normal.     Breath sounds:  Normal breath sounds.  Abdominal:     Palpations: Abdomen is soft. There is no pulsatile mass.     Tenderness: There is no abdominal tenderness.  Musculoskeletal:     Right lower leg: No edema.     Left lower leg: No edema.     Comments: Left wrist, tender to palpation  at [facility] joint as well as first MCP joint, tender to palpation over the APL, EPB tendons with positive Lourena Simmonds.  Lymphadenopathy:     Cervical: No cervical adenopathy.  Skin:    General: Skin is warm and dry.  Neurological:     Mental Status: She is alert and oriented to person, place, and time.  Psychiatric:        Mood and Affect: Mood normal.        Behavior: Behavior normal.   47 minutes spent during visit, including chart review, counseling and assimilation of information, exam, discussion of plan, and chart completion.     Assessment & Plan:  Carly Wilson is a 50 y.o. female . Gastroesophageal reflux disease, unspecified whether esophagitis present - Plan: famotidine (PEPCID) 20 MG tablet Upper airway cough syndrome - Plan: famotidine (PEPCID) 20 MG tablet  -Trigger avoidance discussed for reflux, including alcohol and nicotine.  Handout given.  Continue Pepcid.  Alcohol use  -Decreased use recommended.  Option of resources if needed to cut back.  Cigarette nicotine dependence without complication  -Not ready to quit at this time, advised to let me know and I am happy to help her when she is ready.  Impact on reflux and health discussed.  Depression with anxiety - Plan: DULoxetine (CYMBALTA) 30 MG capsule  -Intolerant to higher doses of Cymbalta, will continue same dose at her request.  Consider psychiatry eval if persistent symptoms and need for medication changes.  Screening for cervical cancer - Plan: Ambulatory referral to Gynecology  Chronic pain syndrome - Plan: Ambulatory referral to Pain Clinic, DULoxetine (CYMBALTA) 30 MG capsule  -Refer to pain management for ongoing care, continue  Cymbalta.  May be able to temporarily refill tramadol if needed until seen by pain management.  Left wrist pain - Plan: Ambulatory referral to Hand Surgery De Quervain's tenosynovitis, left - Plan: Ambulatory referral to Hand Surgery  -Suspect 2 different issues, Decore veins as well as possible arthritis within her wrist, thumb as above.  Handout given, activity modification discussed, use of brace discussed, has 1 on order.  Refer to hand specialist.  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  Handout given on treatment, RTC/ER precautions if not continuing to improve or acute worsening.  Meds ordered this encounter  Medications   famotidine (PEPCID) 20 MG tablet    Sig: Take 1 tablet (20 mg total) by mouth at bedtime.    Dispense:  90 tablet    Refill:  3   DULoxetine (CYMBALTA) 30 MG capsule    Sig: Take 1 capsule (30 mg total) by mouth daily.    Dispense:  30 capsule    Refill:  5   Patient Instructions  See information below on TMJ syndrome.  Ibuprofen temporarily may help.  If the symptoms or not continuing to improve in the next week or any worsening sooner, return for recheck.  If neck pain worsens, return for recheck..  Return to the clinic or go to the nearest emergency room if any of your symptoms worsen or new symptoms occur.  I do recommend decreasing alcohol use.  Let me know when you are ready to quit smoking - I am happy to help.  I have placed a referral to pain management as well as OB/GYN. 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  See  information below.  Use of a brace and decreasing repetitive activity would be initial approach.  Please follow-up in 1 month, can discuss your medications and health further at that time.  Sooner if any worsening symptoms.  Temporomandibular Joint  Syndrome Temporomandibular joint syndrome (TMJ syndrome) is a condition that causes pain in the temporomandibular joints. These joints are located near your ears and allow your jaw to open and close. For people with TMJ syndrome, chewing, biting, or other movements of the jaw can be difficult or painful. TMJ syndrome is often mild and goes away within a few weeks. However, sometimes the condition becomes a long-term (chronic) problem. What are the causes? This condition may be caused by: Grinding your teeth or clenching your jaw. Some people do this when they are under stress. Arthritis. Injury to the jaw. Head or neck injury. Teeth or dentures that are not aligned well. In some cases, the cause of TMJ syndrome may not be known. What are the signs or symptoms? The most common symptom of this condition is an aching pain on the side of the head in the area of the TMJ. Other symptoms may include: Pain when moving your jaw, such as when chewing or biting. Being unable to open your jaw all the way. Making a clicking sound when you open your mouth. Headache. Earache. Neck or shoulder pain. How is this diagnosed? This condition may be diagnosed based on: Your symptoms and medical history. A physical exam. Your health care provider may check the range of motion of your jaw. Imaging tests, such as X-rays or an MRI. You may also need to see your dentist, who will determine if your teeth and jaw are lined up correctly. How is this treated? TMJ syndrome often goes away on its own. If treatment is needed, the options may include: Eating soft foods and applying ice or heat. Medicines to relieve pain or inflammation. Medicines or massage to relax the muscles. A splint, bite plate, or mouthpiece to prevent teeth grinding or jaw clenching. Relaxation techniques or counseling to help reduce stress. A therapy for pain in which an electrical current is applied to the nerves through the skin  (transcutaneous electrical nerve stimulation). Acupuncture. This is sometimes helpful to relieve pain. Jaw surgery. This is rarely needed. Follow these instructions at home: Eating and drinking Eat a soft diet if you are having trouble chewing. Avoid foods that require a lot of chewing. Do not chew gum. General instructions Take over-the-counter and prescription medicines only as told by your health care provider. If directed, put ice on the painful area. Put ice in a plastic bag. Place a towel between your skin and the bag. Leave the ice on for 20 minutes, 2-3 times a day. Apply a warm, wet cloth (warm compress) to the painful area as directed. Massage your jaw area and do any jaw stretching exercises as told by your health care provider. If you were given a splint, bite plate, or mouthpiece, wear it as told by your health care provider. Keep all follow-up visits as told by your health care provider. This is important. Contact a health care provider if: You are having trouble eating. You have new or worsening symptoms. Get help right away if: Your jaw locks open or closed. Summary Temporomandibular joint syndrome (TMJ syndrome) is a condition that causes pain in the temporomandibular joints. These joints are located near your ears and allow your jaw to open and close. TMJ syndrome is often mild and goes  away within a few weeks. However, sometimes the condition becomes a long-term (chronic) problem. Symptoms include an aching pain on the side of the head in the area of the TMJ, pain when chewing or biting, and being unable to open your jaw all the way. You may also make a clicking sound when you open your mouth. TMJ syndrome often goes away on its own. If treatment is needed, it may include medicines to relieve pain, reduce inflammation, or relax the muscles. A splint, bite plate, or mouthpiece may also be used to prevent teeth grinding or jaw clenching. This information is not intended to  replace advice given to you by your health care provider. Make sure you discuss any questions you have with your health care provider. Document Revised: 02/02/2021 Document Reviewed: 12/04/2017 Elsevier Patient Education  2022 [employer] Inc.   Tommi Rumps Quervain's Tenosynovitis De Quervain's tenosynovitis is a condition that causes inflammation of the tendon on the thumb side of the wrist. Tendons are cords of tissue that connect bones to muscles. The tendons in the hand pass through a tunnel called a sheath. A slippery layer of tissue (synovium) lets the tendons move smoothly in the sheath. With de Quervain's tenosynovitis, the sheath swells or thickens, causing friction and pain. The condition is also called de Quervain's disease and de Quervain's syndrome. It occurs most often in women who are 41-1 years old. What are the causes? The exact cause of this condition is not known. It may be associated with overuse of the hand and wrist. What increases the risk? You are more likely to develop this condition if you: Use your hands far more than normal, especially if you repeat certain movements that involve twisting your hand or using a tight grip. Are pregnant. Are a middle-aged woman. Have rheumatoid arthritis. Have diabetes. What are the signs or symptoms? The main symptom of this condition is pain on the thumb side of the wrist. The pain may get worse when you grasp something or turn your wrist. Other symptoms may include: Pain that extends up the forearm. Swelling of your wrist and hand. Trouble moving the thumb and wrist. A sensation of snapping in the wrist. A bump filled with fluid (cyst) in the area of the pain. How is this diagnosed? This condition may be diagnosed based on: Your symptoms and medical history. A physical exam. During the exam, your health care provider may do a simple test Lourena Simmonds test) that involves pulling your thumb and wrist to see if this causes pain. You may  also need to have an X-ray or ultrasound. How is this treated? Treatment for this condition may include: Avoiding any activity that causes pain and swelling. Taking medicines. Anti-inflammatory medicines and corticosteroid injections may be used to reduce inflammation and relieve pain. Wearing a splint. Having surgery. This may be needed if other treatments do not work. Once the pain and swelling have gone down, you may start: Physical therapy. This includes exercises to improve movement and strength in your wrist and thumb. Occupational therapy. This includes adjusting how you move your wrist. Follow these instructions at home: If you have a splint: Wear the splint as told by your health care provider. Remove it only as told by your health care provider. Loosen the splint if your fingers tingle, become numb, or turn cold and blue. Keep the splint clean. If the splint is not waterproof: Do not let it get wet. Cover it with a watertight covering when you take a bath or  a shower. Managing pain, stiffness, and swelling  Avoid movements and activities that cause pain and swelling in the wrist area. If directed, put ice on the painful area. This may be helpful after doing activities that involve the sore wrist. To do this: Put ice in a plastic bag. Place a towel between your skin and the bag. Leave the ice on for 20 minutes, 2-3 times a day. Remove the ice if your skin turns bright red. This is very important. If you cannot feel pain, heat, or cold, you have a greater risk of damage to the area. Move your fingers often to reduce stiffness and swelling. Raise (elevate) the injured area above the level of your heart while you are sitting or lying down. General instructions Return to your normal activities as told by your health care provider. Ask your health care provider what activities are safe for you. Take over-the-counter and prescription medicines only as told by your health care  provider. Keep all follow-up visits. This is important. Contact a health care provider if: Your pain medicine does not help. Your pain gets worse. You develop new symptoms. Summary De Quervain's tenosynovitis is a condition that causes inflammation of the tendon on the thumb side of the wrist. The condition occurs most often in women who are 53-23 years old. The exact cause of this condition is not known. It may be associated with overuse of the hand and wrist. Treatment starts with avoiding activity that causes pain or swelling in the wrist area. Other treatments may include wearing a splint and taking medicine. Sometimes, surgery is needed. This information is not intended to replace advice given to you by your health care provider. Make sure you discuss any questions you have with your health care provider. Document Revised: 02/04/2020 Document Reviewed: 02/04/2020 Elsevier Patient Education  2022 [employer].    Food Choices for Gastroesophageal Reflux Disease, Adult When you have gastroesophageal reflux disease (GERD), the foods you eat and your eating habits are very important. Choosing the right foods can help ease your discomfort. Think about working with a food expert (dietitian) to help you make good choices. What are tips for following this plan? Reading food labels Look for foods that are low in saturated fat. Foods that may help with your symptoms include: Foods that have less than 5% of daily value (DV) of fat. Foods that have 0 grams of trans fat. Cooking Do not fry your food. Cook your food by baking, steaming, grilling, or broiling. These are all methods that do not need a lot of fat for cooking. To add flavor, try to use herbs that are low in spice and acidity. Meal planning  Choose healthy foods that are low in fat, such as: Fruits and vegetables. Whole grains. Low-fat dairy products. Lean meats, fish, and poultry. Eat small meals often instead of eating 3  large meals each day. Eat your meals slowly in a place where you are relaxed. Avoid bending over or lying down until 2-3 hours after eating. Limit high-fat foods such as fatty meats or fried foods. Limit your intake of fatty foods, such as oils, butter, and shortening. Avoid the following as told by your doctor: Foods that cause symptoms. These may be different for different people. Keep a food diary to keep track of foods that cause symptoms. Alcohol. Drinking a lot of liquid with meals. Eating meals during the 2-3 hours before bed. Lifestyle Stay at a healthy weight. Ask your doctor what weight is healthy  for you. If you need to lose weight, work with your doctor to do so safely. Exercise for at least 30 minutes on 5 or more days each week, or as told by your doctor. Wear loose-fitting clothes. Do not smoke or use any products that contain nicotine or tobacco. If you need help quitting, ask your doctor. Sleep with the head of your bed higher than your feet. Use a wedge under the mattress or blocks under the bed frame to raise the head of the bed. Chew sugar-free gum after meals. What foods should eat? Eat a healthy, well-balanced diet of fruits, vegetables, whole grains, low-fat dairy products, lean meats, fish, and poultry. Each person is different. Foods that may cause symptoms in one person may not cause any symptoms in another person. Work with your doctor to find foods that are safe for you. The items listed above may not be a complete list of what you can eat and drink. Contact a food expert for more options. What foods should I avoid? Limiting some of these foods may help in managing the symptoms of GERD. Everyone is different. Talk with a food expert or your doctor to help you find the exact foods to avoid, if any. Fruits Any fruits prepared with added fat. Any fruits that cause symptoms. For some people, this may include citrus fruits, such as oranges, grapefruit, pineapple, and  lemons. Vegetables Deep-fried vegetables. [place] fries. Any vegetables prepared with added fat. Any vegetables that cause symptoms. For some people, this may include tomatoes and tomato products, chili peppers, onions and garlic, and horseradish. Grains Pastries or quick breads with added fat. Meats and other proteins High-fat meats, such as fatty beef or pork, hot dogs, ribs, ham, sausage, salami, and bacon. Fried meat or protein, including fried fish and fried chicken. Nuts and nut butters, in large amounts. Dairy Whole milk and chocolate milk. Sour cream. Cream. Ice cream. Cream cheese. Milkshakes. Fats and oils Butter. Margarine. Shortening. Ghee. Beverages Coffee and tea, with or without caffeine. Carbonated beverages. Sodas. Energy drinks. Fruit juice made with acidic fruits, such as orange or grapefruit. Tomato juice. Alcoholic drinks. Sweets and desserts Chocolate and cocoa. Donuts. Seasonings and condiments Pepper. Peppermint and spearmint. Added salt. Any condiments, herbs, or seasonings that cause symptoms. For some people, this may include curry, hot sauce, or vinegar-based salad dressings. The items listed above may not be a complete list of what you should not eat and drink. Contact a food expert for more options. Questions to ask your doctor Diet and lifestyle changes are often the first steps that are taken to manage symptoms of GERD. If diet and lifestyle changes do not help, talk with your doctor about taking medicines. Where to find more information International [employer] G[employer]intestinal [employer]: aboutgerd.org Summary When you have GERD, food and lifestyle choices are very important in easing your symptoms. Eat small meals often instead of 3 large meals a day. Eat your meals slowly and in a place where you are relaxed. Avoid bending over or lying down until 2-3 hours after eating. Limit high-fat foods such as fatty meats or fried foods. This information is not  intended to replace advice given to you by your health care provider. Make sure you discuss any questions you have with your health care provider. Document Revised: 05/03/2020 Document Reviewed: 05/03/2020 Elsevier Patient Education  2022 Elsevier Inc.     Signed,   Meredith Staggers, MD [facility], [facility] 08/22/21 4:31  PM

## 2021-08-22 NOTE — Patient Instructions (Addendum)
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  Ibuprofen temporarily may help.  If the symptoms or not continuing to improve in the next week or any worsening sooner, return for recheck.  If neck pain worsens, return for recheck..  Return to the clinic or go to the nearest emergency room if any of your symptoms worsen or new symptoms occur.  I do recommend decreasing alcohol use.  Let me know when you are ready to quit smoking - I am happy to help.  I have placed a referral to pain management as well as OB/GYN. 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  See information below.  Use of a brace and decreasing repetitive activity would be initial approach.  Please follow-up in 1 month, can discuss your medications and health further at that time.  Sooner if any worsening symptoms.  Temporomandibular Joint Syndrome Temporomandibular joint syndrome (TMJ syndrome) is a condition that causes pain in the temporomandibular joints. These joints are located near your ears and allow your jaw to open and close. For people with TMJ syndrome, chewing, biting, or other movements of the jaw can be difficult or painful. TMJ syndrome is often mild and goes away within a few weeks. However, sometimes the condition becomes a long-term (chronic) problem. What are the causes? This condition may be caused by: Grinding your teeth or clenching your jaw. Some people do this when they are under stress. Arthritis. Injury to the jaw. Head or neck injury. Teeth or dentures that are not aligned well. In some cases, the cause of TMJ syndrome may not be known. What are the signs or symptoms? The most common symptom of this condition is an aching pain on the side of the head in the area of the TMJ. Other symptoms may include: Pain when moving your jaw, such as when chewing or biting. Being unable to open your jaw all the way. Making  a clicking sound when you open your mouth. Headache. Earache. Neck or shoulder pain. How is this diagnosed? This condition may be diagnosed based on: Your symptoms and medical history. A physical exam. Your health care provider may check the range of motion of your jaw. Imaging tests, such as X-rays or an MRI. You may also need to see your dentist, who will determine if your teeth and jaw are lined up correctly. How is this treated? TMJ syndrome often goes away on its own. If treatment is needed, the options may include: Eating soft foods and applying ice or heat. Medicines to relieve pain or inflammation. Medicines or massage to relax the muscles. A splint, bite plate, or mouthpiece to prevent teeth grinding or jaw clenching. Relaxation techniques or counseling to help reduce stress. A therapy for pain in which an electrical current is applied to the nerves through the skin (transcutaneous electrical nerve stimulation). Acupuncture. This is sometimes helpful to relieve pain. Jaw surgery. This is rarely needed. Follow these instructions at home: Eating and drinking Eat a soft diet if you are having trouble chewing. Avoid foods that require a lot of chewing. Do not chew gum. General instructions Take over-the-counter and prescription medicines only as told by your health care provider. If directed, put ice on the painful area. Put ice in a plastic bag. Place a towel between your skin and the bag. Leave the ice on for 20 minutes, 2-3 times a day. Apply a warm, wet cloth (warm  compress) to the painful area as directed. Massage your jaw area and do any jaw stretching exercises as told by your health care provider. If you were given a splint, bite plate, or mouthpiece, wear it as told by your health care provider. Keep all follow-up visits as told by your health care provider. This is important. Contact a health care provider if: You are having trouble eating. You have new or worsening  symptoms. Get help right away if: Your jaw locks open or closed. Summary Temporomandibular joint syndrome (TMJ syndrome) is a condition that causes pain in the temporomandibular joints. These joints are located near your ears and allow your jaw to open and close. TMJ syndrome is often mild and goes away within a few weeks. However, sometimes the condition becomes a long-term (chronic) problem. Symptoms include an aching pain on the side of the head in the area of the TMJ, pain when chewing or biting, and being unable to open your jaw all the way. You may also make a clicking sound when you open your mouth. TMJ syndrome often goes away on its own. If treatment is needed, it may include medicines to relieve pain, reduce inflammation, or relax the muscles. A splint, bite plate, or mouthpiece may also be used to prevent teeth grinding or jaw clenching. This information is not intended to replace advice given to you by your health care provider. Make sure you discuss any questions you have with your health care provider. Document Revised: 02/02/2021 Document Reviewed: 12/04/2017 Elsevier Patient Education  2022 [employer]   Tommi Rumps Quervain's Tenosynovitis De Quervain's tenosynovitis is a condition that causes inflammation of the tendon on the thumb side of the wrist. Tendons are cords of tissue that connect bones to muscles. The tendons in the hand pass through a tunnel called a sheath. A slippery layer of tissue (synovium) lets the tendons move smoothly in the sheath. With de Quervain's tenosynovitis, the sheath swells or thickens, causing friction and pain. The condition is also called de Quervain's disease and de Quervain's syndrome. It occurs most often in women who are 78-80 years old. What are the causes? The exact cause of this condition is not known. It may be associated with overuse of the hand and wrist. What increases the risk? You are more likely to develop this condition if you: Use your  hands far more than normal, especially if you repeat certain movements that involve twisting your hand or using a tight grip. Are pregnant. Are a middle-aged woman. Have rheumatoid arthritis. Have diabetes. What are the signs or symptoms? The main symptom of this condition is pain on the thumb side of the wrist. The pain may get worse when you grasp something or turn your wrist. Other symptoms may include: Pain that extends up the forearm. Swelling of your wrist and hand. Trouble moving the thumb and wrist. A sensation of snapping in the wrist. A bump filled with fluid (cyst) in the area of the pain. How is this diagnosed? This condition may be diagnosed based on: Your symptoms and medical history. A physical exam. During the exam, your health care provider may do a simple test Lourena Simmonds test) that involves pulling your thumb and wrist to see if this causes pain. You may also need to have an X-ray or ultrasound. How is this treated? Treatment for this condition may include: Avoiding any activity that causes pain and swelling. Taking medicines. Anti-inflammatory medicines and corticosteroid injections may be used to reduce inflammation and relieve pain.  Wearing a splint. Having surgery. This may be needed if other treatments do not work. Once the pain and swelling have gone down, you may start: Physical therapy. This includes exercises to improve movement and strength in your wrist and thumb. Occupational therapy. This includes adjusting how you move your wrist. Follow these instructions at home: If you have a splint: Wear the splint as told by your health care provider. Remove it only as told by your health care provider. Loosen the splint if your fingers tingle, become numb, or turn cold and blue. Keep the splint clean. If the splint is not waterproof: Do not let it get wet. Cover it with a watertight covering when you take a bath or a shower. Managing pain, stiffness, and  swelling  Avoid movements and activities that cause pain and swelling in the wrist area. If directed, put ice on the painful area. This may be helpful after doing activities that involve the sore wrist. To do this: Put ice in a plastic bag. Place a towel between your skin and the bag. Leave the ice on for 20 minutes, 2-3 times a day. Remove the ice if your skin turns bright red. This is very important. If you cannot feel pain, heat, or cold, you have a greater risk of damage to the area. Move your fingers often to reduce stiffness and swelling. Raise (elevate) the injured area above the level of your heart while you are sitting or lying down. General instructions Return to your normal activities as told by your health care provider. Ask your health care provider what activities are safe for you. Take over-the-counter and prescription medicines only as told by your health care provider. Keep all follow-up visits. This is important. Contact a health care provider if: Your pain medicine does not help. Your pain gets worse. You develop new symptoms. Summary De Quervain's tenosynovitis is a condition that causes inflammation of the tendon on the thumb side of the wrist. The condition occurs most often in women who are 2-56 years old. The exact cause of this condition is not known. It may be associated with overuse of the hand and wrist. Treatment starts with avoiding activity that causes pain or swelling in the wrist area. Other treatments may include wearing a splint and taking medicine. Sometimes, surgery is needed. This information is not intended to replace advice given to you by your health care provider. Make sure you discuss any questions you have with your health care provider. Document Revised: 02/04/2020 Document Reviewed: 02/04/2020 Elsevier Patient Education  2022 [employer].    Food Choices for Gastroesophageal Reflux Disease, Adult When you have gastroesophageal reflux  disease (GERD), the foods you eat and your eating habits are very important. Choosing the right foods can help ease your discomfort. Think about working with a food expert (dietitian) to help you make good choices. What are tips for following this plan? Reading food labels Look for foods that are low in saturated fat. Foods that may help with your symptoms include: Foods that have less than 5% of daily value (DV) of fat. Foods that have 0 grams of trans fat. Cooking Do not fry your food. Cook your food by baking, steaming, grilling, or broiling. These are all methods that do not need a lot of fat for cooking. To add flavor, try to use herbs that are low in spice and acidity. Meal planning  Choose healthy foods that are low in fat, such as: Fruits and vegetables. Whole grains. Low-fat  dairy products. Lean meats, fish, and poultry. Eat small meals often instead of eating 3 large meals each day. Eat your meals slowly in a place where you are relaxed. Avoid bending over or lying down until 2-3 hours after eating. Limit high-fat foods such as fatty meats or fried foods. Limit your intake of fatty foods, such as oils, butter, and shortening. Avoid the following as told by your doctor: Foods that cause symptoms. These may be different for different people. Keep a food diary to keep track of foods that cause symptoms. Alcohol. Drinking a lot of liquid with meals. Eating meals during the 2-3 hours before bed. Lifestyle Stay at a healthy weight. Ask your doctor what weight is healthy for you. If you need to lose weight, work with your doctor to do so safely. Exercise for at least 30 minutes on 5 or more days each week, or as told by your doctor. Wear loose-fitting clothes. Do not smoke or use any products that contain nicotine or tobacco. If you need help quitting, ask your doctor. Sleep with the head of your bed higher than your feet. Use a wedge under the mattress or blocks under the bed frame  to raise the head of the bed. Chew sugar-free gum after meals. What foods should eat? Eat a healthy, well-balanced diet of fruits, vegetables, whole grains, low-fat dairy products, lean meats, fish, and poultry. Each person is different. Foods that may cause symptoms in one person may not cause any symptoms in another person. Work with your doctor to find foods that are safe for you. The items listed above may not be a complete list of what you can eat and drink. Contact a food expert for more options. What foods should I avoid? Limiting some of these foods may help in managing the symptoms of GERD. Everyone is different. Talk with a food expert or your doctor to help you find the exact foods to avoid, if any. Fruits Any fruits prepared with added fat. Any fruits that cause symptoms. For some people, this may include citrus fruits, such as oranges, grapefruit, pineapple, and lemons. Vegetables Deep-fried vegetables. [place] fries. Any vegetables prepared with added fat. Any vegetables that cause symptoms. For some people, this may include tomatoes and tomato products, chili peppers, onions and garlic, and horseradish. Grains Pastries or quick breads with added fat. Meats and other proteins High-fat meats, such as fatty beef or pork, hot dogs, ribs, ham, sausage, salami, and bacon. Fried meat or protein, including fried fish and fried chicken. Nuts and nut butters, in large amounts. Dairy Whole milk and chocolate milk. Sour cream. Cream. Ice cream. Cream cheese. Milkshakes. Fats and oils Butter. Margarine. Shortening. Ghee. Beverages Coffee and tea, with or without caffeine. Carbonated beverages. Sodas. Energy drinks. Fruit juice made with acidic fruits, such as orange or grapefruit. Tomato juice. Alcoholic drinks. Sweets and desserts Chocolate and cocoa. Donuts. Seasonings and condiments Pepper. Peppermint and spearmint. Added salt. Any condiments, herbs, or seasonings that cause symptoms.  For some people, this may include curry, hot sauce, or vinegar-based salad dressings. The items listed above may not be a complete list of what you should not eat and drink. Contact a food expert for more options. Questions to ask your doctor Diet and lifestyle changes are often the first steps that are taken to manage symptoms of GERD. If diet and lifestyle changes do not help, talk with your doctor about taking medicines. Where to find more information [employer]: aboutgerd.org  Summary When you have GERD, food and lifestyle choices are very important in easing your symptoms. Eat small meals often instead of 3 large meals a day. Eat your meals slowly and in a place where you are relaxed. Avoid bending over or lying down until 2-3 hours after eating. Limit high-fat foods such as fatty meats or fried foods. This information is not intended to replace advice given to you by your health care provider. Make sure you discuss any questions you have with your health care provider. Document Revised: 05/03/2020 Document Reviewed: 05/03/2020 Elsevier Patient Education  2022 [employer].

## 2021-08-23 ENCOUNTER — Encounter: Payer: Self-pay | Admitting: Family Medicine

## 2021-08-30 ENCOUNTER — Ambulatory Visit: Payer: No Typology Code available for payment source | Admitting: Orthopedic Surgery

## 2021-09-06 ENCOUNTER — Other Ambulatory Visit (HOSPITAL_COMMUNITY)
Admission: RE | Admit: 2021-09-06 | Discharge: 2021-09-06 | Disposition: A | Discharge status: Home / Self Care | Payer: No Typology Code available for payment source | Source: Ambulatory Visit | Attending: Obstetrics & Gynecology | Admitting: Obstetrics & Gynecology

## 2021-09-06 ENCOUNTER — Encounter (HOSPITAL_BASED_OUTPATIENT_CLINIC_OR_DEPARTMENT_OTHER): Payer: Self-pay | Admitting: Obstetrics & Gynecology

## 2021-09-06 ENCOUNTER — Other Ambulatory Visit: Payer: Self-pay

## 2021-09-06 ENCOUNTER — Ambulatory Visit (HOSPITAL_BASED_OUTPATIENT_CLINIC_OR_DEPARTMENT_OTHER)
Admission: RE | Admit: 2021-09-06 | Discharge: 2021-09-06 | Disposition: A | Discharge status: Home / Self Care | Payer: No Typology Code available for payment source | Source: Ambulatory Visit | Attending: Obstetrics & Gynecology | Admitting: Obstetrics & Gynecology

## 2021-09-06 ENCOUNTER — Ambulatory Visit (INDEPENDENT_AMBULATORY_CARE_PROVIDER_SITE_OTHER): Payer: No Typology Code available for payment source | Admitting: Obstetrics & Gynecology

## 2021-09-06 VITALS — BP 139/73 | HR 79 | Ht 64.5 in | Wt 179.2 lb

## 2021-09-06 DIAGNOSIS — Z1211 Encounter for screening for malignant neoplasm of colon: Secondary | ICD-10-CM | POA: Diagnosis not present

## 2021-09-06 DIAGNOSIS — Z01419 Encounter for gynecological examination (general) (routine) without abnormal findings: Secondary | ICD-10-CM

## 2021-09-06 DIAGNOSIS — M2550 Pain in unspecified joint: Secondary | ICD-10-CM

## 2021-09-06 DIAGNOSIS — Z1231 Encounter for screening mammogram for malignant neoplasm of breast: Secondary | ICD-10-CM | POA: Insufficient documentation

## 2021-09-06 DIAGNOSIS — Z124 Encounter for screening for malignant neoplasm of cervix: Secondary | ICD-10-CM | POA: Insufficient documentation

## 2021-09-06 DIAGNOSIS — R232 Flushing: Secondary | ICD-10-CM | POA: Diagnosis not present

## 2021-09-06 DIAGNOSIS — Z8261 Family history of arthritis: Secondary | ICD-10-CM

## 2021-09-06 DIAGNOSIS — K625 Hemorrhage of anus and rectum: Secondary | ICD-10-CM

## 2021-09-06 MED ORDER — GABAPENTIN 100 MG PO CAPS: ORAL_CAPSULE | ORAL | 1 refills | Status: DC

## 2021-09-06 NOTE — Patient Instructions (Signed)
Pneumonia vaccination that is recommended for you right now is Prevnar 20.

## 2021-09-06 NOTE — Progress Notes (Signed)
50 y.o. G1P1 MWF here for next patient appt.  H/o abnormal pap smears in the past.  Last one was in 2018.  Reports cycles are short and light and she's only had two in the past year.  These only lasted a few days.  Is having hot flashes.  Has not had a mammogram in about four years.  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  Has chronic joint pain.  No specific diagnosis.  Almost every joint hurts--wrists, shoulders, neck, hips, knees.  Fingers are still a lot.  Is on Cymbalta.  Does not think this has helped with the pain.  Reports she's not had much for work up.  Does think she has a grandmother with hx of RA.  Patient's last menstrual period was 07/18/2021.          Sexually active: Yes.    The current method of family planning is none.    Exercising: No.  Smoker:  yes  Health Maintenance: Pap:  today History of abnormal Pap:  history of abnormal pap smears  MMG:  guidelines reviewed Colonoscopy:  not done Screening Labs:  see orders   reports that she has been smoking cigarettes. She has a 26.00 pack-year smoking history. She has never used smokeless tobacco. She reports current alcohol use. She reports current drug use. Drug: Marijuana.  Past Medical History:  Diagnosis Date   Allergy    Anemia    Anxiety    Arthritis    Chronic headaches    Depression    GERD (gastroesophageal reflux disease)     Past Surgical History:  Procedure Laterality Date   APPENDECTOMY  11/06/1988   BACK SURGERY  2001 and 2011   BACK SURGERY      Current Outpatient Medications  Medication Sig Dispense Refill   acetaminophen (TYLENOL) 650 MG CR tablet Take 2 tablets by mouth daily.     ALPRAZolam (XANAX) 1 MG tablet Take 1 tablet by mouth daily.     DULoxetine (CYMBALTA) 30 MG capsule Take 1 capsule (30 mg total) by mouth daily. 30 capsule 5   famotidine (PEPCID) 20 MG tablet Take 1 tablet (20 mg total) by mouth at bedtime. 90 tablet 3   promethazine (PHENERGAN) 25 MG tablet Take 1 tablet by  mouth every 6 (six) hours as needed.     traMADol (ULTRAM) 50 MG tablet 1-2 every 4 hours as needed for cough or pain 40 tablet 0   No current facility-administered medications for this visit.    Family History  Problem Relation Age of Onset   Miscarriages / [place] Mother    Cancer Mother    Arthritis Mother    Allergies Mother    Hypertension Father    Hyperlipidemia Father    Drug abuse Father    COPD Father    Cancer Father    Heart disease Maternal Grandmother    COPD Maternal Grandmother    Breast cancer Maternal Grandmother    COPD Maternal Grandfather    Cancer Maternal Grandfather        "bile duct"   Rheum arthritis Paternal Grandmother    COPD Paternal Grandfather    Emphysema Paternal Grandfather     Review of Systems  Musculoskeletal:  Positive for arthralgias.   Exam:   BP 139/73 (BP Location: Right Arm, Patient Position: Sitting, Cuff Size: Large)   Pulse 79   Ht 5' 4.5" (1.638 m)   Wt 179 lb 3.2 oz (81.3 kg)   LMP 07/18/2021  BMI 30.28 kg/m   Height: 5' 4.5" (163.8 cm)  General appearance: alert, cooperative and appears stated age Head: Normocephalic, without obvious abnormality, atraumatic Neck: no adenopathy, supple, symmetrical, trachea midline and thyroid normal to inspection and palpation Lungs: clear to auscultation bilaterally Breasts: normal appearance, no masses or tenderness Heart: regular rate and rhythm Abdomen: soft, non-tender; bowel sounds normal; no masses,  no organomegaly Extremities: extremities normal, atraumatic, no cyanosis or edema Skin: Skin color, texture, turgor normal. No rashes or lesions Lymph nodes: Cervical, supraclavicular, and axillary nodes normal. No abnormal inguinal nodes palpated Neurologic: Grossly normal   Pelvic: External genitalia:  no lesions              Urethra:  normal appearing urethra with no masses, tenderness or lesions              Bartholins and Skenes: normal                 Vagina:  normal appearing vagina with normal color and no discharge, no lesions              Cervix: no lesions              Pap taken: Yes.   Bimanual Exam:  Uterus:  normal size, contour, position, consistency, mobility, non-tender              Adnexa: normal adnexa and no mass, fullness, tenderness               Rectovaginal: Confirms               Anus:  normal sphincter tone, no lesions  Chaperone, Leanor Rubenstein, CMA, was present for exam.  Assessment/Plan: 1. Well woman exam with routine gynecological exam - pap and HR HPV obtained today - Order placed for MMG - Referral for colonoscopy placed - care gaps reviewed/updated  2. Hot flashes - gabapentin (NEURONTIN) 100 MG capsule; Take 1 capsule nightly x three nights, then increase to 2 capsules nightly x 3 nights and then increase to 3 capsules nightly.  Dispense: 60 capsule; Refill: 1  3. Family history of rheumatoid arthritis - Sedimentation rate - ANA - CBC with Differential/Platelet - Comprehensive metabolic panel - Rheumatoid factor  4. Pain in joint, multiple sites  5. Colon cancer screening - Ambulatory referral to Gastroenterology  6. Rectal bleeding

## 2021-09-07 LAB — CBC WITH DIFFERENTIAL/PLATELET
Basophils Absolute: 0.1 10*3/uL (ref 0.0–0.2)
Basos: 1 %
EOS (ABSOLUTE): 0.2 10*3/uL (ref 0.0–0.4)
Eos: 2 %
Hematocrit: 44.4 % (ref 34.0–46.6)
Hemoglobin: 15.2 g/dL (ref 11.1–15.9)
Immature Grans (Abs): 0 10*3/uL (ref 0.0–0.1)
Immature Granulocytes: 0 %
Lymphocytes Absolute: 1.9 10*3/uL (ref 0.7–3.1)
Lymphs: 26 %
MCH: 31.8 pg (ref 26.6–33.0)
MCHC: 34.2 g/dL (ref 31.5–35.7)
MCV: 93 fL (ref 79–97)
Monocytes Absolute: 0.7 10*3/uL (ref 0.1–0.9)
Monocytes: 10 %
Neutrophils Absolute: 4.3 10*3/uL (ref 1.4–7.0)
Neutrophils: 61 %
Platelets: 131 10*3/uL — ABNORMAL LOW (ref 150–450)
RBC: 4.78 x10E6/uL (ref 3.77–5.28)
RDW: 12.4 % (ref 11.7–15.4)
WBC: 7.2 10*3/uL (ref 3.4–10.8)

## 2021-09-07 LAB — COMPREHENSIVE METABOLIC PANEL
ALT: 19 IU/L (ref 0–32)
AST: 16 IU/L (ref 0–40)
Albumin/Globulin Ratio: 2.4 — ABNORMAL HIGH (ref 1.2–2.2)
Albumin: 4.8 g/dL (ref 3.8–4.8)
Alkaline Phosphatase: 61 IU/L (ref 44–121)
BUN/Creatinine Ratio: 13 (ref 9–23)
BUN: 9 mg/dL (ref 6–24)
Bilirubin Total: 0.3 mg/dL (ref 0.0–1.2)
CO2: 23 mmol/L (ref 20–29)
Calcium: 10.2 mg/dL (ref 8.7–10.2)
Chloride: 103 mmol/L (ref 96–106)
Creatinine, Ser: 0.7 mg/dL (ref 0.57–1.00)
Globulin, Total: 2 g/dL (ref 1.5–4.5)
Glucose: 91 mg/dL (ref 70–99)
Potassium: 4.4 mmol/L (ref 3.5–5.2)
Sodium: 141 mmol/L (ref 134–144)
Total Protein: 6.8 g/dL (ref 6.0–8.5)
eGFR: 106 mL/min/{1.73_m2} (ref >59)

## 2021-09-07 LAB — SEDIMENTATION RATE: Sed Rate: 12 mm/hr (ref 0–32)

## 2021-09-07 LAB — ANA: Anti Nuclear Antibody (ANA): NEGATIVE

## 2021-09-07 LAB — RHEUMATOID FACTOR: Rheumatoid fact SerPl-aCnc: <10 IU/mL (ref <14.0)

## 2021-09-08 DIAGNOSIS — R232 Flushing: Secondary | ICD-10-CM | POA: Insufficient documentation

## 2021-09-08 DIAGNOSIS — M2550 Pain in unspecified joint: Secondary | ICD-10-CM | POA: Insufficient documentation

## 2021-09-08 DIAGNOSIS — K625 Hemorrhage of anus and rectum: Secondary | ICD-10-CM | POA: Insufficient documentation

## 2021-09-08 LAB — CYTOLOGY - PAP
Comment: NEGATIVE
Diagnosis: NEGATIVE
High risk HPV: NEGATIVE

## 2021-09-23 ENCOUNTER — Encounter: Payer: Self-pay | Admitting: Family Medicine

## 2021-09-23 ENCOUNTER — Ambulatory Visit (INDEPENDENT_AMBULATORY_CARE_PROVIDER_SITE_OTHER): Payer: Self-pay | Admitting: Family Medicine

## 2021-09-23 VITALS — BP 130/74 | HR 76 | Temp 98.2°F | Resp 16 | Ht 64.5 in | Wt 181.6 lb

## 2021-09-23 DIAGNOSIS — Z789 Other specified health status: Secondary | ICD-10-CM

## 2021-09-23 DIAGNOSIS — M654 Radial styloid tenosynovitis [de Quervain]: Secondary | ICD-10-CM

## 2021-09-23 DIAGNOSIS — M26629 Arthralgia of temporomandibular joint, unspecified side: Secondary | ICD-10-CM

## 2021-09-23 DIAGNOSIS — F418 Other specified anxiety disorders: Secondary | ICD-10-CM

## 2021-09-23 DIAGNOSIS — M25532 Pain in left wrist: Secondary | ICD-10-CM

## 2021-09-23 DIAGNOSIS — R0781 Pleurodynia: Secondary | ICD-10-CM

## 2021-09-23 DIAGNOSIS — G894 Chronic pain syndrome: Secondary | ICD-10-CM

## 2021-09-23 DIAGNOSIS — K219 Gastro-esophageal reflux disease without esophagitis: Secondary | ICD-10-CM

## 2021-09-23 MED ORDER — OMEPRAZOLE 20 MG PO CPDR
20.0000 mg | DELAYED_RELEASE_CAPSULE | Freq: Every day | ORAL | 3 refills | Status: DC
Start: 2021-09-23 — End: 2022-03-29

## 2021-09-23 NOTE — Patient Instructions (Addendum)
I'm glad to hear the wrists are better - keep follow up with ortho and pain management.  If current dose of duloxetine is not treating depression/anxiety, I recommend follow up with a psychiatrist. Let me know.  Keep up the good work with cutting back on alcohol. If you have difficulty with cutting back, let me know as I can help provide resources. Alcohol may also worsen heartburn, but can return to omeprazole daily, with pepcid as needed.  [facility] ribs at [facility], but discuss with pain management as well.  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   Return to the clinic or go to the nearest emergency room if any of your symptoms worsen or new symptoms occur.

## 2021-09-23 NOTE — Progress Notes (Signed)
Subjective:  Patient ID: Carly Wilson, female    DOB: [birth year]  Age: 50 y.o. MRN: [id number]  CC:  Chief Complaint  Patient presents with   Referral    Pt has seen GYN, has appt with Pain management, has to reschedule ortho, no concerns    Headache    Pt has had one and off headaches was advised to return 1 month     HPI Carly Wilson presents for   Follow-up from October 17 visit.  New patient to establish care with me at that time.  Chronic pain syndrome with depression, anxiety, alcohol use/abuse Intolerant to 60 mg dose of Cymbalta, was continued on 30 mg.  Prior side effects with venlafaxine. No benefit with added wellbutrin in the past.  Rare use of Xanax.  Rare use of tramadol, few times per month.  Option of psychiatry eval if persistent depression/anxiety symptoms or need for medication changes due to intolerance of meds as above.  She was referred to pain management - appt next week.  Mood is stable on duloxetine.   On gabapentin now for hot flushes by OBGYN - this has helped pain as well as sleeping better. At last visit reported 12 to 42 cans of beer per week.  Some nights not drinking at all.  Did recommend cutting back on alcohol use. Has cut back on alcohol some. 8-10this week. Denies difficulty cutting back.   Depression screen [facility] 2/9 09/23/2021 09/06/2021 08/22/2021  Decreased Interest 1 0 1  Down, Depressed, Hopeless 1 1 1   PHQ - 2 Score 2 1 2   Altered sleeping 0 - 3  Tired, decreased energy 1 - 2  Change in appetite 0 - 0  Feeling bad or failure about yourself  1 - 2  Trouble concentrating 0 - 1  Moving slowly or fidgety/restless 0 - 0  Suicidal thoughts 0 - 0  PHQ-9 Score 4 - 10  No flowsheet data found.  Headache, left TMJ syndrome.  Left jaw, left head pain discussed at last visit, thought to be TMJ syndrome.  Chronic high-pitched tinnitus.  Temporary ibuprofen discussed. Overall better. Some soreness few days ago at left TMJ- better now.   Still taking ibuprofen, but less often.   Left De Quervain's tenosynovitis Discussed last visit.  Activity modification discussed, and she had ordered a brace.  likely complicated by underlying osteoarthritis.  Referred to Ortho/hand specialist.  Having to reschedule. Using brace - less soreness. Wearing brace on both wrists - minimal sx's on right.   New concern: L lower rib pain Stabbing feeling in bone, for years. Occasionally lower R rib, Occurs most days.  No rash/skin changes. No current treatments.  D/w prior PCP - told had inflammation years ago.  Discussed years later - thought was heartburn - put on meds for heartburn.  Xrays in past ok.   Omeprazole worked better than pepcid since last visit - more heartburn.   History Patient Active Problem List   Diagnosis Date Noted   Rectal bleeding 09/08/2021   Hot flashes 09/08/2021   Pain in joint, multiple sites 09/08/2021   Upper airway cough syndrome vs cough variant asthma 05/25/2014   Smoker 05/25/2014   Past Medical History:  Diagnosis Date   Allergy    Anemia    Anxiety    Arthritis    Chronic headaches    Depression    GERD (gastroesophageal reflux disease)    Past Surgical History:  Procedure Laterality Date   APPENDECTOMY  11/06/1988   CARPAL TUNNEL RELEASE Left 1996   wtih ganglion cyst removal   LUMBAR FUSION  2011   MICRODISCECTOMY LUMBAR  2001   Allergies  Allergen Reactions   Sulfa Antibiotics     Nausea    Vicodin [Hydrocodone-Acetaminophen]     nausea   Prior to Admission medications   Medication Sig Start Date End Date Taking? Authorizing Provider  acetaminophen (TYLENOL) 650 MG CR tablet Take 2 tablets by mouth daily.   Yes [provider]  ALPRAZolam Prudy Feeler) 1 MG tablet Take 1 tablet by mouth daily. 01/17/21  Yes [provider]  DULoxetine (CYMBALTA) 30 MG capsule Take 1 capsule (30 mg total) by mouth daily. 08/22/21  Yes Shade Flood, MD  famotidine (PEPCID) 20 MG  tablet Take 1 tablet (20 mg total) by mouth at bedtime. 08/22/21  Yes Shade Flood, MD  gabapentin (NEURONTIN) 100 MG capsule Take 1 capsule nightly x three nights, then increase to 2 capsules nightly x 3 nights and then increase to 3 capsules nightly. 09/06/21  Yes Jerene Bears, MD  promethazine (PHENERGAN) 25 MG tablet Take 1 tablet by mouth every 6 (six) hours as needed. 06/20/19  Yes [provider]  traMADol Janean Sark) 50 MG tablet 1-2 every 4 hours as needed for cough or pain 06/08/14  Yes Nyoka Cowden, MD   Social History   Socioeconomic History   Marital status: Divorced    Spouse name: Not on file   Number of children: Not on file   Years of education: Not on file   Highest education level: Not on file  Occupational History   Not on file  Tobacco Use   Smoking status: Every Day    Packs/day: 1.00    Years: 26.00    Pack years: 26.00    Types: Cigarettes   Smokeless tobacco: Never  Substance and Sexual Activity   Alcohol use: Yes    Comment: 12-42 beers a week   Drug use: Yes    Types: Marijuana   Sexual activity: Yes    Birth control/protection: None  Other Topics Concern   Not on file  Social History Narrative   Not on file   Social Determinants of Health   Financial Resource Strain: Not on file  Food Insecurity: Not on file  Transportation Needs: Not on file  Physical Activity: Not on file  Stress: Not on file  Social Connections: Not on file  Intimate Partner Violence: Not on file    Review of Systems Per HPI.   Objective:   Vitals:   09/23/21 1010  BP: 130/74  Pulse: 76  Resp: 16  Temp: 98.2 F (36.8 C)  TempSrc: Temporal  SpO2: 95%  Weight: 181 lb 9.6 oz (82.4 kg)  Height: 5' 4.5" (1.638 m)    Physical Exam Vitals reviewed.  Constitutional:      Appearance: Normal appearance. She is well-developed.  HENT:     Head: Normocephalic and atraumatic.     Comments: Min ttp left TMJ, no click or movement restriction.   Eyes:      Conjunctiva/sclera: Conjunctivae normal.     Pupils: Pupils are equal, round, and reactive to light.  Neck:     Vascular: No carotid bruit.  Cardiovascular:     Rate and Rhythm: Normal rate and regular rhythm.     Heart sounds: Normal heart sounds.  Pulmonary:     Effort: Pulmonary effort is normal. No respiratory distress.  Breath sounds: Normal breath sounds. No wheezing or rhonchi.     Comments: Min ttp lower rib margin - md clavicular line at costochondral junction.  Abdominal:     Palpations: Abdomen is soft. There is no pulsatile mass.     Tenderness: There is no abdominal tenderness.  Musculoskeletal:     Right lower leg: No edema.     Left lower leg: No edema.  Skin:    General: Skin is warm and dry.  Neurological:     Mental Status: She is alert and oriented to person, place, and time.  Psychiatric:        Mood and Affect: Mood normal.        Behavior: Behavior normal.       Assessment & Plan:  SHAHANA CAPES is a 50 y.o. female . Gastroesophageal reflux disease, unspecified whether esophagitis present - Plan: omeprazole (PRILOSEC) 20 MG capsule  -Change back to omeprazole for improved control with option of adding Pepcid.  Rib pain - Plan: DG Ribs Unilateral Left  -Longstanding symptoms, likely component of costochondritis based on location.  Check rib imaging.  May need to discuss also with pain management, no med changes for now.  Also discussed cough with smoking may also contribute to discomfort - another benefit for cessation.  TMJ syndrome  -Improved, stretches discussed with handout given.  RTC precautions  Depression with anxiety  -Stable, unable to tolerate higher dose of Cymbalta or other meds as above.  If worsening symptoms would consider psychiatry eval.  Left wrist pain De Quervain's tenosynovitis, left Improved, continue brace, activity modification.  Alcohol use  -Commended on decreased, continue to cut back and is likely affecting  heartburn as well possible component of substance-induced mood disorder.  Advised to let me know if any difficulty with cutting back or need for resources.  Chronic pain syndrome Appointment pending with pain management.  No med changes at this time.  Meds ordered this encounter  Medications   omeprazole (PRILOSEC) 20 MG capsule    Sig: Take 1 capsule (20 mg total) by mouth daily.    Dispense:  30 capsule    Refill:  3   Patient Instructions  I'm glad to hear the wrists are better - keep follow up with ortho and pain management.  If current dose of duloxetine is not treating depression/anxiety, I recommend follow up with a psychiatrist. Let me know.  Keep up the good work with cutting back on alcohol. If you have difficulty with cutting back, let me know as I can help provide resources. Alcohol may also worsen heartburn, but can return to omeprazole daily, with pepcid as needed.  Xray for ribs at [facility], but discuss with pain management as well.  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   Return to the clinic or go to the nearest emergency room if any of your symptoms worsen or new symptoms occur.      Signed,   Meredith Staggers, MD  Primary Care, [facility] 09/23/21 10:56 AM

## 2021-11-18 ENCOUNTER — Other Ambulatory Visit (HOSPITAL_BASED_OUTPATIENT_CLINIC_OR_DEPARTMENT_OTHER): Payer: Self-pay | Admitting: Obstetrics & Gynecology

## 2021-11-18 DIAGNOSIS — M2550 Pain in unspecified joint: Secondary | ICD-10-CM

## 2021-11-18 DIAGNOSIS — Z8261 Family history of arthritis: Secondary | ICD-10-CM

## 2021-11-18 DIAGNOSIS — R232 Flushing: Secondary | ICD-10-CM

## 2021-12-23 ENCOUNTER — Ambulatory Visit: Payer: No Typology Code available for payment source | Admitting: Family Medicine

## 2022-02-22 ENCOUNTER — Other Ambulatory Visit: Payer: Self-pay | Admitting: Family Medicine

## 2022-02-22 DIAGNOSIS — G894 Chronic pain syndrome: Secondary | ICD-10-CM

## 2022-02-22 DIAGNOSIS — F418 Other specified anxiety disorders: Secondary | ICD-10-CM

## 2022-03-27 ENCOUNTER — Other Ambulatory Visit: Payer: Self-pay | Admitting: Family Medicine

## 2022-03-27 DIAGNOSIS — F418 Other specified anxiety disorders: Secondary | ICD-10-CM

## 2022-03-27 DIAGNOSIS — G894 Chronic pain syndrome: Secondary | ICD-10-CM

## 2022-03-28 ENCOUNTER — Telehealth: Payer: Self-pay | Admitting: Family Medicine

## 2022-03-29 ENCOUNTER — Ambulatory Visit (INDEPENDENT_AMBULATORY_CARE_PROVIDER_SITE_OTHER): Payer: No Typology Code available for payment source | Admitting: Family Medicine

## 2022-03-29 ENCOUNTER — Encounter: Payer: Self-pay | Admitting: Family Medicine

## 2022-03-29 VITALS — BP 128/76 | HR 79 | Temp 98.1°F | Resp 15 | Ht 64.5 in | Wt 177.2 lb

## 2022-03-29 DIAGNOSIS — R195 Other fecal abnormalities: Secondary | ICD-10-CM

## 2022-03-29 DIAGNOSIS — R058 Other specified cough: Secondary | ICD-10-CM

## 2022-03-29 DIAGNOSIS — F418 Other specified anxiety disorders: Secondary | ICD-10-CM

## 2022-03-29 DIAGNOSIS — G894 Chronic pain syndrome: Secondary | ICD-10-CM | POA: Diagnosis not present

## 2022-03-29 DIAGNOSIS — K219 Gastro-esophageal reflux disease without esophagitis: Secondary | ICD-10-CM

## 2022-03-29 LAB — CBC
HCT: 45.3 % (ref 36.0–46.0)
Hemoglobin: 15.1 g/dL — ABNORMAL HIGH (ref 12.0–15.0)
MCHC: 33.4 g/dL (ref 30.0–36.0)
MCV: 93.2 fl (ref 78.0–100.0)
Platelets: 129 10*3/uL — ABNORMAL LOW (ref 150.0–400.0)
RBC: 4.86 Mil/uL (ref 3.87–5.11)
RDW: 13.3 % (ref 11.5–15.5)
WBC: 8.8 10*3/uL (ref 4.0–10.5)

## 2022-03-29 LAB — IFOBT (OCCULT BLOOD): IFOBT: NEGATIVE

## 2022-03-29 MED ORDER — OMEPRAZOLE 20 MG PO CPDR: 20.0000 mg | DELAYED_RELEASE_CAPSULE | Freq: Every day | ORAL | 3 refills | Status: DC

## 2022-03-29 MED ORDER — DULOXETINE HCL 30 MG PO CPEP: 30.0000 mg | ORAL_CAPSULE | Freq: Every day | ORAL | 1 refills | Status: DC

## 2022-03-29 NOTE — Telephone Encounter (Signed)
Error

## 2022-03-29 NOTE — Progress Notes (Unsigned)
Subjective:  Patient ID: Carly Wilson, female    DOB: 1971/05/20  Age: 51 y.o. MRN: [id number]  CC:  Chief Complaint  Patient presents with   Depression    Pt here for refill on Cymbalta, no concerns at this    Gastroesophageal Reflux    Pt in need of refill on prilosec    Immunizations    Pt is willing to do shingrix today     HPI Carly Wilson presents for   Depression: With chronic pain syndrome, anxiety, history of alcohol use/abuse.  Last discussed in November 2022.  Commended on her improvement at that visit as decreased from 12 to 42 cans of beers per week down to 8 to 10/week.  Was treated with gabapentin for hot flashes by OB/GYN that helped pain and was sleeping better.  Unfortunately intolerant to 60 mg dose of Cymbalta, continued on 30 mg.  Prior side effects with venlafaxine.  No benefit with that of Wellbutrin in the past.  Rare use of Xanax for anxiety, rare use of tramadol few per month.  We did discuss option of psychiatry eval if need for medication changes due to prior intolerances.  Followed by pain management.  Continues on Cymbalta 30 mg daily. Doing ok.  Best friend passed away last weekend - sudden, another friend died few weeks ago.  Had been doing ok prior to recent deaths.  Has been praying, talking with friends. Denies need for additional resources or therapy at this time. Will be going to [place] on 6/2 for a month. Will be seeing niece, brother's family, with husband and parents.  Alcohol - overall less - only few on some weeks. Some increased in past week with loss of friend.      03/29/2022    9:13 AM 09/23/2021   10:09 AM 09/06/2021    2:37 PM 08/22/2021    3:30 PM  Depression screen PHQ 2/9  Decreased Interest 2 1 0 1  Down, Depressed, Hopeless 1 1 1 1   PHQ - 2 Score 3 2 1 2   Altered sleeping 3 0  3  Tired, decreased energy 1 1  2   Change in appetite 1 0  0  Feeling bad or failure about yourself  1 1  2   Trouble concentrating 1 0  1   Moving slowly or fidgety/restless 0 0  0  Suicidal thoughts 0 0  0  PHQ-9 Score 10 4  10    GERD Treated with omeprazole   Has worked better than previous Pepcid alone.  No known history of peptic ulcer disease.  Still takes omeprazole daily with improved control. Taking most days, breakthrough if missing meds. Mostly controlled. Sometimes nighttime reflux. Famotodine missed doses at times. Previous plan for EGD, colonoscopy with Dr. Collene Mares?  Alcohol as above.  Tobacco 2 ppd - not ready to quit/cut back.  6 cups coffee per day.  No unexplained wt loss, night sweats or fever. Dark stool on occasion. Black tarry stool - once in past few weeks. Occasional lightheadedness.  No hx of GIB or PUD.   Health maintenance Shingrix planned today   History Patient Active Problem List   Diagnosis Date Noted   Rectal bleeding 09/08/2021   Hot flashes 09/08/2021   Pain in joint, multiple sites 09/08/2021   Upper airway cough syndrome vs cough variant asthma 05/25/2014   Smoker 05/25/2014   Past Medical History:  Diagnosis Date   Allergy    Anemia    Anxiety  Arthritis    Chronic headaches    Depression    GERD (gastroesophageal reflux disease)    Past Surgical History:  Procedure Laterality Date   APPENDECTOMY  11/06/1988   CARPAL TUNNEL RELEASE Left 1996   wtih ganglion cyst removal   LUMBAR FUSION  2011   MICRODISCECTOMY LUMBAR  2001   Allergies  Allergen Reactions   Sulfa Antibiotics     Nausea    Vicodin [Hydrocodone-Acetaminophen]     nausea   Prior to Admission medications   Medication Sig Start Date End Date Taking? Authorizing Provider  acetaminophen (TYLENOL) 650 MG CR tablet Take 2 tablets by mouth daily.   Yes [provider]  ALPRAZolam Duanne Moron) 1 MG tablet Take 1 tablet by mouth daily. 01/17/21  Yes [provider]  DULoxetine (CYMBALTA) 30 MG capsule Take 1 capsule by mouth once daily 02/22/22  Yes Wendie Agreste, MD  famotidine (PEPCID) 20 MG  tablet Take 1 tablet (20 mg total) by mouth at bedtime. 08/22/21  Yes Wendie Agreste, MD  gabapentin (NEURONTIN) 300 MG capsule Take 1 capsule (300 mg total) by mouth at bedtime. TAKE 1 CAPSULE BY MOUTH NIGHTLY FOR 3 NIGHTS, THEN INCREASE TO 2 CAPSULE NIGHTLY FOR 3 NIGHTS AND THEN INCREASE TO 3 CAPSULES NIGHTLY. 11/23/21  Yes Megan Salon, MD  omeprazole (PRILOSEC) 20 MG capsule Take 1 capsule (20 mg total) by mouth daily. 09/23/21  Yes Wendie Agreste, MD  promethazine (PHENERGAN) 25 MG tablet Take 1 tablet by mouth every 6 (six) hours as needed. 06/20/19  Yes [provider]  traMADol Veatrice Bourbon) 50 MG tablet 1-2 every 4 hours as needed for cough or pain 06/08/14  Yes Tanda Rockers, MD   Social History   Socioeconomic History   Marital status: Divorced    Spouse name: Not on file   Number of children: Not on file   Years of education: Not on file   Highest education level: Not on file  Occupational History   Not on file  Tobacco Use   Smoking status: Every Day    Packs/day: 1.00    Years: 26.00    Pack years: 26.00    Types: Cigarettes   Smokeless tobacco: Never  Substance and Sexual Activity   Alcohol use: Yes    Comment: 12-42 beers a week   Drug use: Yes    Types: Marijuana   Sexual activity: Yes    Birth control/protection: None  Other Topics Concern   Not on file  Social History Narrative   Not on file   Social Determinants of Health   Financial Resource Strain: Not on file  Food Insecurity: Not on file  Transportation Needs: Not on file  Physical Activity: Not on file  Stress: Not on file  Social Connections: Not on file  Intimate Partner Violence: Not on file    Review of Systems Per HPI.   Objective:   Vitals:   03/29/22 0910  BP: 128/76  Pulse: 79  Resp: 15  Temp: 98.1 F (36.7 C)  TempSrc: Temporal  SpO2: 97%  Weight: 177 lb 3.2 oz (80.4 kg)  Height: 5' 4.5" (1.638 m)     Physical Exam Vitals reviewed.  Constitutional:       Appearance: Normal appearance. She is well-developed.  HENT:     Head: Normocephalic and atraumatic.  Eyes:     Conjunctiva/sclera: Conjunctivae normal.     Pupils: Pupils are equal, round, and reactive to light.  Neck:  Vascular: No carotid bruit.  Cardiovascular:     Rate and Rhythm: Normal rate and regular rhythm.     Heart sounds: Normal heart sounds.  Pulmonary:     Effort: Pulmonary effort is normal.     Breath sounds: Normal breath sounds.  Abdominal:     Palpations: Abdomen is soft. There is no pulsatile mass.     Tenderness: There is no abdominal tenderness.  Musculoskeletal:     Right lower leg: No edema.     Left lower leg: No edema.  Skin:    General: Skin is warm and dry.  Neurological:     Mental Status: She is alert and oriented to person, place, and time.  Psychiatric:        Mood and Affect: Mood normal.        Behavior: Behavior normal.     Comments: Tearful at one point, but appropriate responses, good eye contact, does not appear to be responding to internal stimuli.no SI.    Results for orders placed or performed in visit on 03/29/22  IFOBT POC (occult bld, rslt in office)  Result Value Ref Range   IFOBT Negative      Assessment & Plan:  Carly Wilson is a 52 y.o. female . Depression with anxiety - Plan: DULoxetine (CYMBALTA) 30 MG capsule Chronic pain syndrome - Plan: DULoxetine (CYMBALTA) 30 MG capsule  -Decided to remain on same dose Cymbalta.  Gabapentin has been prescribed for hot flushes also has been helpful for chronic pain.  Gastroesophageal reflux disease, unspecified whether esophagitis present - Plan: omeprazole (PRILOSEC) 20 MG capsule  Upper airway cough syndrome Dark stools - Plan: IFOBT POC (occult bld, rslt in office), CBC, IFOBT POC (occult bld, rslt in office) -Continue omeprazole, Pepcid -cut back on alcohol, and effective nicotine, alcohol, tobacco discussed on reflux/GERD control.  Handout given on other triggers.  Reports  dark stools but CBC ordered which was overall reassuring negative heme testing in office.  Follow-up with gastroenterology with ER precautions given -including if any return of dark stools or lightheadedness.  Meds ordered this encounter  Medications   DULoxetine (CYMBALTA) 30 MG capsule    Sig: Take 1 capsule (30 mg total) by mouth daily.    Dispense:  90 capsule    Refill:  1   omeprazole (PRILOSEC) 20 MG capsule    Sig: Take 1 capsule (20 mg total) by mouth daily.    Dispense:  90 capsule    Refill:  3   Patient Instructions  I am sorry to hear about the recent losses. Please let me know if you would like some names for therapists.  Continue to cut back on alcohol. If you have difficulty cutting back, let me know and I can help with other resources.  Omeprazole daily, famotidine nightly. Call Dr. Lorie Apley office today to setup appointment to discuss heartburn, dark stools and possible further testing.  Cutting back on nicotine, alcohol, and caffeine will also help heartburn.  If lightheadedness worsens or black stools return - be seen in ER.   Food Choices for Gastroesophageal Reflux Disease, Adult When you have gastroesophageal reflux disease (GERD), the foods you eat and your eating habits are very important. Choosing the right foods can help ease the discomfort of GERD. Consider working with a dietitian to help you make healthy food choices. What are tips for following this plan? Reading food labels Look for foods that are low in saturated fat. Foods that have less than 5%  of daily value (DV) of fat and 0 g of trans fats may help with your symptoms. Cooking Cook foods using methods other than frying. This may include baking, steaming, grilling, or broiling. These are all methods that do not need a lot of fat for cooking. To add flavor, try to use herbs that are low in spice and acidity. Meal planning  Choose healthy foods that are low in fat, such as fruits, vegetables, whole  grains, low-fat dairy products, lean meats, fish, and poultry. Eat frequent, small meals instead of three large meals each day. Eat your meals slowly, in a relaxed setting. Avoid bending over or lying down until 2-3 hours after eating. Limit high-fat foods such as fatty meats or fried foods. Limit your intake of fatty foods, such as oils, butter, and shortening. Avoid the following as told by your health care provider: Foods that cause symptoms. These may be different for different people. Keep a food diary to keep track of foods that cause symptoms. Alcohol. Drinking large amounts of liquid with meals. Eating meals during the 2-3 hours before bed. Lifestyle Maintain a healthy weight. Ask your health care provider what weight is healthy for you. If you need to lose weight, work with your health care provider to do so safely. Exercise for at least 30 minutes on 5 or more days each week, or as told by your health care provider. Avoid wearing clothes that fit tightly around your waist and chest. Do not use any products that contain nicotine or tobacco. These products include cigarettes, chewing tobacco, and vaping devices, such as e-cigarettes. If you need help quitting, ask your health care provider. Sleep with the head of your bed raised. Use a wedge under the mattress or blocks under the bed frame to raise the head of the bed. Chew sugar-free gum after mealtimes. What foods should I eat?  Eat a healthy, well-balanced diet of fruits, vegetables, whole grains, low-fat dairy products, lean meats, fish, and poultry. Each person is different. Foods that may trigger symptoms in one person may not trigger any symptoms in another person. Work with your health care provider to identify foods that are safe for you. The items listed above may not be a complete list of recommended foods and beverages. Contact a dietitian for more information. What foods should I avoid? Limiting some of these foods may help  manage the symptoms of GERD. Everyone is different. Consult a dietitian or your health care provider to help you identify the exact foods to avoid, if any. Fruits Any fruits prepared with added fat. Any fruits that cause symptoms. For some people this may include citrus fruits, such as oranges, grapefruit, pineapple, and lemons. Vegetables Deep-fried vegetables. [place] fries. Any vegetables prepared with added fat. Any vegetables that cause symptoms. For some people, this may include tomatoes and tomato products, chili peppers, onions and garlic, and horseradish. Grains Pastries or quick breads with added fat. Meats and other proteins High-fat meats, such as fatty beef or pork, hot dogs, ribs, ham, sausage, salami, and bacon. Fried meat or protein, including fried fish and fried chicken. Nuts and nut butters, in large amounts. Dairy Whole milk and chocolate milk. Sour cream. Cream. Ice cream. Cream cheese. Milkshakes. Fats and oils Butter. Margarine. Shortening. Ghee. Beverages Coffee and tea, with or without caffeine. Carbonated beverages. Sodas. Energy drinks. Fruit juice made with acidic fruits, such as orange or grapefruit. Tomato juice. Alcoholic drinks. Sweets and desserts Chocolate and cocoa. Donuts. Seasonings and condiments Pepper.  Peppermint and spearmint. Added salt. Any condiments, herbs, or seasonings that cause symptoms. For some people, this may include curry, hot sauce, or vinegar-based salad dressings. The items listed above may not be a complete list of foods and beverages to avoid. Contact a dietitian for more information. Questions to ask your health care provider Diet and lifestyle changes are usually the first steps that are taken to manage symptoms of GERD. If diet and lifestyle changes do not improve your symptoms, talk with your health care provider about taking medicines. Where to find more information International Foundation for Gastrointestinal Disorders:  aboutgerd.org Summary When you have gastroesophageal reflux disease (GERD), food and lifestyle choices may be very helpful in easing the discomfort of GERD. Eat frequent, small meals instead of three large meals each day. Eat your meals slowly, in a relaxed setting. Avoid bending over or lying down until 2-3 hours after eating. Limit high-fat foods such as fatty meats or fried foods. This information is not intended to replace advice given to you by your health care provider. Make sure you discuss any questions you have with your health care provider. Document Revised: 05/03/2020 Document Reviewed: 05/03/2020 Elsevier Patient Education  [place],   Merri Ray, MD [place], [facility] 03/29/22 10:19 AM

## 2022-03-29 NOTE — Patient Instructions (Addendum)
I am sorry to hear about the recent losses. Please let me know if you would like some names for therapists.  Continue to cut back on alcohol. If you have difficulty cutting back, let me know and I can help with other resources.  Omeprazole daily, famotidine nightly. Call Dr. Kenna Gilbert office today to setup appointment to discuss heartburn, dark stools and possible further testing.  Cutting back on nicotine, alcohol, and caffeine will also help heartburn.  If lightheadedness worsens or black stools return - be seen in ER.   Food Choices for Gastroesophageal Reflux Disease, Adult When you have gastroesophageal reflux disease (GERD), the foods you eat and your eating habits are very important. Choosing the right foods can help ease the discomfort of GERD. Consider working with a dietitian to help you make healthy food choices. What are tips for following this plan? Reading food labels Look for foods that are low in saturated fat. Foods that have less than 5% of daily value (DV) of fat and 0 g of trans fats may help with your symptoms. Cooking Cook foods using methods other than frying. This may include baking, steaming, grilling, or broiling. These are all methods that do not need a lot of fat for cooking. To add flavor, try to use herbs that are low in spice and acidity. Meal planning  Choose healthy foods that are low in fat, such as fruits, vegetables, whole grains, low-fat dairy products, lean meats, fish, and poultry. Eat frequent, small meals instead of three large meals each day. Eat your meals slowly, in a relaxed setting. Avoid bending over or lying down until 2-3 hours after eating. Limit high-fat foods such as fatty meats or fried foods. Limit your intake of fatty foods, such as oils, butter, and shortening. Avoid the following as told by your health care provider: Foods that cause symptoms. These may be different for different people. Keep a food diary to keep track of foods that cause  symptoms. Alcohol. Drinking large amounts of liquid with meals. Eating meals during the 2-3 hours before bed. Lifestyle Maintain a healthy weight. Ask your health care provider what weight is healthy for you. If you need to lose weight, work with your health care provider to do so safely. Exercise for at least 30 minutes on 5 or more days each week, or as told by your health care provider. Avoid wearing clothes that fit tightly around your waist and chest. Do not use any products that contain nicotine or tobacco. These products include cigarettes, chewing tobacco, and vaping devices, such as e-cigarettes. If you need help quitting, ask your health care provider. Sleep with the head of your bed raised. Use a wedge under the mattress or blocks under the bed frame to raise the head of the bed. Chew sugar-free gum after mealtimes. What foods should I eat?  Eat a healthy, well-balanced diet of fruits, vegetables, whole grains, low-fat dairy products, lean meats, fish, and poultry. Each person is different. Foods that may trigger symptoms in one person may not trigger any symptoms in another person. Work with your health care provider to identify foods that are safe for you. The items listed above may not be a complete list of recommended foods and beverages. Contact a dietitian for more information. What foods should I avoid? Limiting some of these foods may help manage the symptoms of GERD. Everyone is different. Consult a dietitian or your health care provider to help you identify the exact foods to avoid, if any.  Fruits Any fruits prepared with added fat. Any fruits that cause symptoms. For some people this may include citrus fruits, such as oranges, grapefruit, pineapple, and lemons. Vegetables Deep-fried vegetables. [place] fries. Any vegetables prepared with added fat. Any vegetables that cause symptoms. For some people, this may include tomatoes and tomato products, chili peppers, onions and  garlic, and horseradish. Grains Pastries or quick breads with added fat. Meats and other proteins High-fat meats, such as fatty beef or pork, hot dogs, ribs, ham, sausage, salami, and bacon. Fried meat or protein, including fried fish and fried chicken. Nuts and nut butters, in large amounts. Dairy Whole milk and chocolate milk. Sour cream. Cream. Ice cream. Cream cheese. Milkshakes. Fats and oils Butter. Margarine. Shortening. Ghee. Beverages Coffee and tea, with or without caffeine. Carbonated beverages. Sodas. Energy drinks. Fruit juice made with acidic fruits, such as orange or grapefruit. Tomato juice. Alcoholic drinks. Sweets and desserts Chocolate and cocoa. Donuts. Seasonings and condiments Pepper. Peppermint and spearmint. Added salt. Any condiments, herbs, or seasonings that cause symptoms. For some people, this may include curry, hot sauce, or vinegar-based salad dressings. The items listed above may not be a complete list of foods and beverages to avoid. Contact a dietitian for more information. Questions to ask your health care provider Diet and lifestyle changes are usually the first steps that are taken to manage symptoms of GERD. If diet and lifestyle changes do not improve your symptoms, talk with your health care provider about taking medicines. Where to find more information International Foundation for Gastrointestinal Disorders: aboutgerd.org Summary When you have gastroesophageal reflux disease (GERD), food and lifestyle choices may be very helpful in easing the discomfort of GERD. Eat frequent, small meals instead of three large meals each day. Eat your meals slowly, in a relaxed setting. Avoid bending over or lying down until 2-3 hours after eating. Limit high-fat foods such as fatty meats or fried foods. This information is not intended to replace advice given to you by your health care provider. Make sure you discuss any questions you have with your health care  provider. Document Revised: 05/03/2020 Document Reviewed: 05/03/2020 Elsevier Patient Education  2023 [employer].

## 2022-04-07 ENCOUNTER — Other Ambulatory Visit (HOSPITAL_BASED_OUTPATIENT_CLINIC_OR_DEPARTMENT_OTHER): Payer: Self-pay | Admitting: *Deleted

## 2022-04-07 DIAGNOSIS — R232 Flushing: Secondary | ICD-10-CM

## 2022-04-07 DIAGNOSIS — M2550 Pain in unspecified joint: Secondary | ICD-10-CM

## 2022-04-07 DIAGNOSIS — Z8261 Family history of arthritis: Secondary | ICD-10-CM

## 2022-04-07 MED ORDER — GABAPENTIN 300 MG PO CAPS: 300.0000 mg | ORAL_CAPSULE | Freq: Every day | ORAL | 2 refills | Status: DC

## 2022-06-29 ENCOUNTER — Ambulatory Visit: Payer: No Typology Code available for payment source | Admitting: Family Medicine

## 2022-06-29 DIAGNOSIS — Z13220 Encounter for screening for lipoid disorders: Secondary | ICD-10-CM

## 2022-06-29 DIAGNOSIS — R7303 Prediabetes: Secondary | ICD-10-CM

## 2022-07-26 ENCOUNTER — Telehealth: Payer: Self-pay | Admitting: Family Medicine

## 2022-07-26 ENCOUNTER — Other Ambulatory Visit: Payer: Self-pay

## 2022-07-26 DIAGNOSIS — F418 Other specified anxiety disorders: Secondary | ICD-10-CM

## 2022-07-26 DIAGNOSIS — G894 Chronic pain syndrome: Secondary | ICD-10-CM

## 2022-07-26 MED ORDER — DULOXETINE HCL 30 MG PO CPEP: 30.0000 mg | ORAL_CAPSULE | Freq: Every day | ORAL | 1 refills | Status: DC

## 2022-07-26 NOTE — Telephone Encounter (Signed)
Encourage patient to contact the pharmacy for refills or they can request refills through [facility]  (Please schedule appointment if patient has not been seen in over a year)    WHAT PHARMACY WOULD THEY LIKE THIS SENT TO:  [facility] [address] [place] - [place] [address] MEDICATION NAME & DOSE: Duloxetine 30 mg  NOTES/COMMENTS FROM PATIENT: Pt only has 8 capsules left and pt will out of town until 08/06/22. Pt want to know can she get a partial refill until her appt on 08/07/22.      [place] office please notify patient: It takes 48-72 hours to process rx refill requests Ask patient to call pharmacy to ensure rx is ready before heading there.

## 2022-07-27 NOTE — Telephone Encounter (Signed)
Pt calling to check on refill; states that she will run out next Wednesday while she is out of town  Encourage patient to contact the pharmacy for refills or they can request refills through [facility]  (Please schedule appointment if patient has not been seen in over a year)    WHAT [place] TO: [facility] [address]   MEDICATION NAME & DOSE: duloxetine 30 mg  NOTES/COMMENTS FROM PATIENT:      [place] office please notify patient: It takes 48-72 hours to process rx refill requests Ask patient to call pharmacy to ensure rx is ready before heading there.

## 2022-08-07 ENCOUNTER — Ambulatory Visit (INDEPENDENT_AMBULATORY_CARE_PROVIDER_SITE_OTHER): Payer: No Typology Code available for payment source | Admitting: Family Medicine

## 2022-08-07 ENCOUNTER — Encounter: Payer: Self-pay | Admitting: Family Medicine

## 2022-08-07 VITALS — BP 122/80 | HR 86 | Temp 98.1°F | Ht 64.0 in | Wt 178.8 lb

## 2022-08-07 DIAGNOSIS — Z789 Other specified health status: Secondary | ICD-10-CM

## 2022-08-07 DIAGNOSIS — Z23 Encounter for immunization: Secondary | ICD-10-CM

## 2022-08-07 DIAGNOSIS — K219 Gastro-esophageal reflux disease without esophagitis: Secondary | ICD-10-CM | POA: Diagnosis not present

## 2022-08-07 DIAGNOSIS — F418 Other specified anxiety disorders: Secondary | ICD-10-CM | POA: Diagnosis not present

## 2022-08-07 DIAGNOSIS — G894 Chronic pain syndrome: Secondary | ICD-10-CM

## 2022-08-07 MED ORDER — DULOXETINE HCL 30 MG PO CPEP: 30.0000 mg | ORAL_CAPSULE | Freq: Every day | ORAL | 5 refills | Status: DC

## 2022-08-07 NOTE — Patient Instructions (Addendum)
Follow up with pain management.  Follow up with Dr. Collene Mares as planned to discuss heartburn and prior abdominal symptoms. Try to decrease alcohol and tobacco as those both can worsen heartburn.  No change in Cymbalta for now.  If you have trouble losing weight with decreased alcohol or diet changes please schedule visit and we can discuss further.   Return to the clinic or go to the nearest emergency room if any of your symptoms worsen or new symptoms occur.

## 2022-08-07 NOTE — Progress Notes (Signed)
Subjective:  Patient ID: Carly Wilson, female    DOB: May 05, 1971  Age: 51 y.o. MRN: [id number]  CC:  Chief Complaint  Patient presents with   Depression    Pt states al is well PHQ9 - 6   Anxiety    GAD 8    HPI Carly Wilson presents for   Depression with anxiety Last discussed in May.  History of chronic pain syndrome, prior alcohol use/abuse that was improving Unfortunately intolerant to higher doses of Cymbalta 60 mg, continue on 30 mg.  Prior side effects of venlafaxine.  No benefit with Wellbutrin in the past.  Carly Wilson planned on follow up by pain management.  Remained on Cymbalta 30 mg daily, and had decreased to only a few drinks per week.  Additionally Carly Wilson has been prescribed gabapentin for hot flushes that were helping chronic pain.  Declined additional resources for counseling last visit after some friends that passed. Saw pain mgt once, did not follow up. The prior provider left? Received a letter that doctor was leaving.  Enjoyed trip to [place].  Mood doing ok - cymbalta doing well. Doing better since last week.  Alcohol: some increase at beach last week, prior to beach - none on some days, 3-4 on other days. Denies need for assistance with cutting back.  Xanax - once every week or two only. Controlled substance database (PDMP) reviewed. No concerns appreciated. Last filled 11/18/21.  Insurance requires 30d rx.      08/07/2022   11:31 AM 03/29/2022    9:13 AM 09/23/2021   10:09 AM 09/06/2021    2:37 PM 08/22/2021    3:30 PM  Depression screen PHQ 2/9  Decreased Interest 1 2 1  0 1  Down, Depressed, Hopeless 1 1 1 1 1   PHQ - 2 Score 2 3 2 1 2   Altered sleeping 0 3 0  3  Tired, decreased energy 1 1 1  2   Change in appetite 0 1 0  0  Feeling bad or failure about yourself  1 1 1  2   Trouble concentrating 2 1 0  1  Moving slowly or fidgety/restless 0 0 0  0  Suicidal thoughts 0 0 0  0  PHQ-9 Score 6 10 4  10       08/07/2022   11:32 AM  GAD 7 : Generalized  Anxiety Score  Nervous, Anxious, on Edge 2  Control/stop worrying 1  Worry too much - different things 2  Trouble relaxing 1  Restless 0  Easily annoyed or irritable 1  Afraid - awful might happen 1  Total GAD 7 Score 8   GERD Discussed in May, dark stools noted at that time but negative heme testing in office and CBC was normal.  Gastroenterology follow-up recommended, as well as avoidance of triggers including nicotine, alcohol.  Continued on omeprazole and Pepcid. Carly Wilson did not follow up with Dr. - unknown reason, procrastinated. No black stools. No abd pain.  Still some breakthrough heartburn at times.  Lab Results  Component Value Date   WBC 8.8 03/29/2022   HGB 15.1 (H) 03/29/2022   HCT 45.3 03/29/2022   MCV 93.2 03/29/2022   PLT 129.0 (L) 03/29/2022     History Patient Active Problem List   Diagnosis Date Noted   Rectal bleeding 09/08/2021   Hot flashes 09/08/2021   Pain in joint, multiple sites 09/08/2021   Upper airway cough syndrome vs cough variant asthma 05/25/2014   Smoker 05/25/2014   Past  Medical History:  Diagnosis Date   Allergy    Anemia    Anxiety    Arthritis    Chronic headaches    Depression    GERD (gastroesophageal reflux disease)    Past Surgical History:  Procedure Laterality Date   APPENDECTOMY  11/06/1988   CARPAL TUNNEL RELEASE Left 1996   wtih ganglion cyst removal   LUMBAR FUSION  2011   MICRODISCECTOMY LUMBAR  2001   Allergies  Allergen Reactions   Sulfa Antibiotics     Nausea    Vicodin [Hydrocodone-Acetaminophen]     nausea   Prior to Admission medications   Medication Sig Start Date End Date Taking? Authorizing Provider  acetaminophen (TYLENOL) 650 MG CR tablet Take 2 tablets by mouth daily.   Yes [provider]  ALPRAZolam Duanne Moron) 1 MG tablet Take 1 tablet by mouth daily. 01/17/21  Yes [provider]  DULoxetine (CYMBALTA) 30 MG capsule Take 1 capsule (30 mg total) by mouth daily. 07/26/22  Yes  Wendie Agreste, MD  famotidine (PEPCID) 20 MG tablet Take 1 tablet (20 mg total) by mouth at bedtime. 08/22/21  Yes Wendie Agreste, MD  gabapentin (NEURONTIN) 300 MG capsule Take 1 capsule (300 mg total) by mouth at bedtime. TAKE 1 CAPSULE BY MOUTH NIGHTLY FOR 3 NIGHTS, THEN INCREASE TO 2 CAPSULE NIGHTLY FOR 3 NIGHTS AND THEN INCREASE TO 3 CAPSULES NIGHTLY. 04/07/22  Yes Megan Salon, MD  omeprazole (PRILOSEC) 20 MG capsule Take 1 capsule (20 mg total) by mouth daily. 03/29/22  Yes Wendie Agreste, MD  promethazine (PHENERGAN) 25 MG tablet Take 1 tablet by mouth every 6 (six) hours as needed. 06/20/19  Yes [provider]  traMADol Veatrice Bourbon) 50 MG tablet 1-2 every 4 hours as needed for cough or pain 06/08/14  Yes Tanda Rockers, MD   Social History   Socioeconomic History   Marital status: Divorced    Spouse name: Not on file   Number of children: Not on file   Years of education: Not on file   Highest education level: Not on file  Occupational History   Not on file  Tobacco Use   Smoking status: Every Day    Packs/day: 1.00    Years: 26.00    Total pack years: 26.00    Types: Cigarettes   Smokeless tobacco: Never  Substance and Sexual Activity   Alcohol use: Yes    Comment: 12-42 beers a week   Drug use: Yes    Types: Marijuana   Sexual activity: Yes    Birth control/protection: None  Other Topics Concern   Not on file  Social History Narrative   Not on file   Social Determinants of Health   Financial Resource Strain: Not on file  Food Insecurity: Not on file  Transportation Needs: Not on file  Physical Activity: Not on file  Stress: Not on file  Social Connections: Not on file  Intimate Partner Violence: Not on file    Review of Systems Per HPI.   Objective:   Vitals:   08/07/22 1137  BP: 122/80  Pulse: 86  Temp: 98.1 F (36.7 C)  SpO2: 97%  Weight: 178 lb 12.8 oz (81.1 kg)  Height: 5\' 4"  (1.626 m)     Physical Exam Vitals reviewed.   Constitutional:      Appearance: Normal appearance. Carly Wilson is well-developed.  HENT:     Head: Normocephalic and atraumatic.  Eyes:     Conjunctiva/sclera: Conjunctivae  normal.     Pupils: Pupils are equal, round, and reactive to light.  Neck:     Vascular: No carotid bruit.  Cardiovascular:     Rate and Rhythm: Normal rate and regular rhythm.     Heart sounds: Normal heart sounds.  Pulmonary:     Effort: Pulmonary effort is normal.     Breath sounds: Normal breath sounds.  Abdominal:     Palpations: Abdomen is soft. There is no pulsatile mass.     Tenderness: There is no abdominal tenderness.  Musculoskeletal:     Right lower leg: No edema.     Left lower leg: No edema.  Skin:    General: Skin is warm and dry.  Neurological:     Mental Status: Carly Wilson is alert and oriented to person, place, and time.  Psychiatric:        Mood and Affect: Mood normal.        Behavior: Behavior normal.        Thought Content: Thought content normal.        Assessment & Plan:  LORREE MILLAR is a 51 y.o. female . Depression with anxiety - Plan: DULoxetine (CYMBALTA) 30 MG capsule  -Overall stable with current dose of Cymbalta, rare need for benzodiazepine.  Continue same.  Option of psychiatry eval if medication adjustment needed given difficulty with higher doses of Cymbalta and intolerances to other meds above.  No changes for now.  Need for shingles vaccine - Plan: Zoster Recombinant (Shingrix ) potential side effects discussed.  Gastroesophageal reflux disease, unspecified whether esophagitis present  -No changes for now but stressed importance of gastroenterology follow-up, and trigger avoidance discussed.  Chronic pain syndrome - Plan: DULoxetine (CYMBALTA) 30 MG capsule  -Continue Cymbalta, follow-up pain management, advised to call to see if other provider assigned as previous provider left.  Alcohol use  -Commended on overall decreased use.  Plans to follow-up if difficulty with  weight loss as Carly Wilson cuts back on alcohol use.  Meds ordered this encounter  Medications   DULoxetine (CYMBALTA) 30 MG capsule    Sig: Take 1 capsule (30 mg total) by mouth daily.    Dispense:  30 capsule    Refill:  5   Patient Instructions  Follow up with pain management.  Follow up with Dr. Loreta Ave as planned to discuss heartburn and prior abdominal symptoms. Try to decrease alcohol and tobacco as those both can worsen heartburn.  No change in Cymbalta for now.  If you have trouble losing weight with decreased alcohol or diet changes please schedule visit and we can discuss further.   Return to the clinic or go to the nearest emergency room if any of your symptoms worsen or new symptoms occur.        Signed,   Meredith Staggers, MD [facility], [facility] 08/07/22 12:14 PM

## 2022-08-08 ENCOUNTER — Encounter: Payer: Self-pay | Admitting: Family Medicine

## 2022-09-19 ENCOUNTER — Other Ambulatory Visit: Payer: Self-pay | Admitting: Family Medicine

## 2022-09-19 DIAGNOSIS — K219 Gastro-esophageal reflux disease without esophagitis: Secondary | ICD-10-CM

## 2022-09-19 DIAGNOSIS — R058 Other specified cough: Secondary | ICD-10-CM

## 2022-12-12 ENCOUNTER — Other Ambulatory Visit: Payer: Self-pay | Admitting: Family Medicine

## 2022-12-12 DIAGNOSIS — K219 Gastro-esophageal reflux disease without esophagitis: Secondary | ICD-10-CM

## 2022-12-12 DIAGNOSIS — R058 Other specified cough: Secondary | ICD-10-CM

## 2023-01-23 ENCOUNTER — Other Ambulatory Visit (HOSPITAL_BASED_OUTPATIENT_CLINIC_OR_DEPARTMENT_OTHER): Payer: Self-pay | Admitting: Obstetrics & Gynecology

## 2023-01-23 DIAGNOSIS — R232 Flushing: Secondary | ICD-10-CM

## 2023-01-23 DIAGNOSIS — Z8261 Family history of arthritis: Secondary | ICD-10-CM

## 2023-01-23 DIAGNOSIS — M2550 Pain in unspecified joint: Secondary | ICD-10-CM

## 2023-01-23 NOTE — Telephone Encounter (Signed)
LMOVM for pt to call office regarding refill request.  Needs appt.

## 2023-01-31 ENCOUNTER — Encounter (HOSPITAL_BASED_OUTPATIENT_CLINIC_OR_DEPARTMENT_OTHER): Payer: Self-pay | Admitting: *Deleted

## 2023-02-25 ENCOUNTER — Other Ambulatory Visit (HOSPITAL_BASED_OUTPATIENT_CLINIC_OR_DEPARTMENT_OTHER): Payer: Self-pay | Admitting: Obstetrics & Gynecology

## 2023-02-25 DIAGNOSIS — M2550 Pain in unspecified joint: Secondary | ICD-10-CM

## 2023-02-25 DIAGNOSIS — Z8261 Family history of arthritis: Secondary | ICD-10-CM

## 2023-02-25 DIAGNOSIS — R232 Flushing: Secondary | ICD-10-CM

## 2023-04-17 ENCOUNTER — Ambulatory Visit (INDEPENDENT_AMBULATORY_CARE_PROVIDER_SITE_OTHER): Payer: 59 | Admitting: Obstetrics & Gynecology

## 2023-04-17 ENCOUNTER — Encounter (HOSPITAL_BASED_OUTPATIENT_CLINIC_OR_DEPARTMENT_OTHER): Payer: Self-pay | Admitting: Obstetrics & Gynecology

## 2023-04-17 VITALS — BP 135/85 | HR 82 | Ht 64.5 in | Wt 179.4 lb

## 2023-04-17 DIAGNOSIS — N9089 Other specified noninflammatory disorders of vulva and perineum: Secondary | ICD-10-CM

## 2023-04-17 DIAGNOSIS — Z8261 Family history of arthritis: Secondary | ICD-10-CM

## 2023-04-17 DIAGNOSIS — M2550 Pain in unspecified joint: Secondary | ICD-10-CM

## 2023-04-17 DIAGNOSIS — Z1211 Encounter for screening for malignant neoplasm of colon: Secondary | ICD-10-CM | POA: Diagnosis not present

## 2023-04-17 DIAGNOSIS — Z01419 Encounter for gynecological examination (general) (routine) without abnormal findings: Secondary | ICD-10-CM | POA: Diagnosis not present

## 2023-04-17 DIAGNOSIS — R232 Flushing: Secondary | ICD-10-CM | POA: Diagnosis not present

## 2023-04-17 DIAGNOSIS — Z1231 Encounter for screening mammogram for malignant neoplasm of breast: Secondary | ICD-10-CM

## 2023-04-17 MED ORDER — GABAPENTIN 300 MG PO CAPS: ORAL_CAPSULE | ORAL | 12 refills | Status: DC

## 2023-04-17 NOTE — Progress Notes (Signed)
52 y.o. G65P1001 Divorced White or Caucasian female here for annual exam.  Did not have any bleeding in the last year.  Does have hot flashes and night sweats.    Continues to have joint pain especially in hands and ankles.  She did have normal sed rate, ANA and negative RF.  Did see pain management but provider retired.  Steroid injections were discussed in the winter of 2022 but needed to cancel and never rescheduled.  In particular, her hands hurt especially in the thumb joint.  At times, she has trouble holding on to objects due to pain.  She takes Tylenol arthritis or Aleve every day.   Using gabapentin for hot flashes.  Did work up to 900mg  nightly.  This helped a lot.  Needs a RF and would like to take during the day as well.    No LMP recorded.          Sexually active: Yes.    The current method of family planning is none.    Smoker:  yes  Health Maintenance: Pap:  09/06/2021 Negative History of abnormal Pap:  no MMG:  09/06/2021 Negative Colonoscopy:  referral made to pt to see Dr. Loreta Ave BMD:   not indicated Screening Labs: done with Dr. Neva Seat   reports that she has been smoking cigarettes. She has a 26.00 pack-year smoking history. She has never used smokeless tobacco. She reports current alcohol use. She reports current drug use. Drug: Marijuana.  Past Medical History:  Diagnosis Date   Allergy    Anemia    Anxiety    Arthritis    Chronic headaches    Depression    GERD (gastroesophageal reflux disease)     Past Surgical History:  Procedure Laterality Date   APPENDECTOMY  11/06/1988   CARPAL TUNNEL RELEASE Left 1996   wtih ganglion cyst removal   LUMBAR FUSION  2011   MICRODISCECTOMY LUMBAR  2001    Current Outpatient Medications  Medication Sig Dispense Refill   acetaminophen (TYLENOL) 650 MG CR tablet Take 2 tablets by mouth daily.     ALPRAZolam (XANAX) 1 MG tablet Take 1 tablet by mouth daily.     DULoxetine (CYMBALTA) 30 MG capsule Take 1 capsule (30 mg  total) by mouth daily. 30 capsule 5   famotidine (PEPCID) 20 MG tablet TAKE 1 TABLET BY MOUTH AT BEDTIME 90 tablet 0   gabapentin (NEURONTIN) 300 MG capsule TAKE 1 CAPSULE BY MOUTH NIGHTLY FOR 3 NIGHTS, INCREASE TO 2 CAPSULES FOR 3 NIGHTS, THEN INCREASE TO 3 CAPSULES NIGHTLY 90 capsule 0   omeprazole (PRILOSEC) 20 MG capsule Take 1 capsule (20 mg total) by mouth daily. 90 capsule 3   promethazine (PHENERGAN) 25 MG tablet Take 1 tablet by mouth every 6 (six) hours as needed.     traMADol (ULTRAM) 50 MG tablet 1-2 every 4 hours as needed for cough or pain 40 tablet 0   No current facility-administered medications for this visit.    Family History  Problem Relation Age of Onset   Miscarriages / [place] Mother    Cancer Mother    Arthritis Mother    Allergies Mother    Hypertension Father    Hyperlipidemia Father    Drug abuse Father    COPD Father    Cancer Father    Heart disease Maternal Grandmother    COPD Maternal Grandmother    Breast cancer Maternal Grandmother    COPD Maternal Grandfather    Cancer Maternal Grandfather        "  bile duct"   Rheum arthritis Paternal Grandmother    COPD Paternal Grandfather    Emphysema Paternal Grandfather     ROS: Constitutional: negative Genitourinary:negative  Exam:   BP 135/85 (BP Location: Left Arm, Patient Position: Sitting, Cuff Size: Large)   Pulse 82   Ht 5' 4.5" (1.638 m) Comment: Reported  Wt 179 lb 6.4 oz (81.4 kg)   BMI 30.32 kg/m   Height: 5' 4.5" (163.8 cm) (Reported)  General appearance: alert, cooperative and appears stated age Head: Normocephalic, without obvious abnormality, atraumatic Neck: no adenopathy, supple, symmetrical, trachea midline and thyroid normal to inspection and palpation Lungs: clear to auscultation bilaterally Breasts: normal appearance, no masses or tenderness Heart: regular rate and rhythm Abdomen: soft, non-tender; bowel sounds normal; no masses,  no organomegaly Extremities:  extremities normal, atraumatic, no cyanosis or edema Skin: Skin color, texture, turgor normal. No rashes or lesions Lymph nodes: Cervical, supraclavicular, and axillary nodes normal. No abnormal inguinal nodes palpated Neurologic: Grossly normal   Pelvic: External genitalia:  three pigmented lesions two on the left and one on the right, two pigmented lesion near rectum as well              Urethra:  normal appearing urethra with no masses, tenderness or lesions              Bartholins and Skenes: normal                 Vagina: normal appearing vagina with normal color and no discharge, no lesions              Cervix: no lesions              Pap taken: No. Bimanual Exam:  Uterus:  normal size, contour, position, consistency, mobility, non-tender              Adnexa: normal adnexa and no mass, fullness, tenderness               Rectovaginal: Confirms               Anus:  normal sphincter tone, no lesions  Chaperone, Ina Homes, CMA, was present for exam.  Assessment/Plan: 1. Well woman exam with routine gynecological exam - Pap smear neg 09/06/2021.  Will repeat next year. - Mammogram 09/07/2021 - Colonoscopy referral placed - Bone mineral density not indicated at this time - lab work done with PCP, Dr. Amanda Cockayne - vaccines reviewed/updated  2. Encounter for screening mammogram for malignant neoplasm of breast - MM 3D SCREENING MAMMOGRAM BILATERAL BREAST; Future  3. Colon cancer screening - Ambulatory referral to Gastroenterology  4. Pain in joint, multiple sites - gabapentin (NEURONTIN) 300 MG capsule; Take 1 capsule in AM and 3 capsules nightly  Dispense: 120 capsule; Refill: 12  5. Family history of rheumatoid arthritis  6. Hot flashes - gabapentin (NEURONTIN) 300 MG capsule; Take 1 capsule in AM and 3 capsules nightly  Dispense: 120 capsule; Refill: 12  7. Polyarthralgia - Ambulatory referral to Rheumatology  8. Vulvar lesion - pt will return for biopsy/excision of vulva  lesion

## 2023-05-01 ENCOUNTER — Encounter (HOSPITAL_BASED_OUTPATIENT_CLINIC_OR_DEPARTMENT_OTHER): Payer: Self-pay | Admitting: Obstetrics & Gynecology

## 2023-05-01 ENCOUNTER — Other Ambulatory Visit (HOSPITAL_COMMUNITY)
Admission: RE | Admit: 2023-05-01 | Discharge: 2023-05-01 | Disposition: A | Discharge status: Home / Self Care | Payer: 59 | Source: Ambulatory Visit | Attending: Obstetrics & Gynecology | Admitting: Obstetrics & Gynecology

## 2023-05-01 ENCOUNTER — Ambulatory Visit (HOSPITAL_BASED_OUTPATIENT_CLINIC_OR_DEPARTMENT_OTHER): Payer: 59 | Admitting: Obstetrics & Gynecology

## 2023-05-01 VITALS — BP 146/69 | HR 87 | Ht 64.5 in | Wt 179.0 lb

## 2023-05-01 DIAGNOSIS — L814 Other melanin hyperpigmentation: Secondary | ICD-10-CM | POA: Diagnosis not present

## 2023-05-01 DIAGNOSIS — N9069 Other specified hypertrophy of vulva: Secondary | ICD-10-CM | POA: Diagnosis not present

## 2023-05-01 DIAGNOSIS — N9089 Other specified noninflammatory disorders of vulva and perineum: Secondary | ICD-10-CM

## 2023-05-01 DIAGNOSIS — L918 Other hypertrophic disorders of the skin: Secondary | ICD-10-CM

## 2023-05-03 DIAGNOSIS — Z1211 Encounter for screening for malignant neoplasm of colon: Secondary | ICD-10-CM | POA: Diagnosis not present

## 2023-05-03 DIAGNOSIS — R1033 Periumbilical pain: Secondary | ICD-10-CM | POA: Diagnosis not present

## 2023-05-03 DIAGNOSIS — K219 Gastro-esophageal reflux disease without esophagitis: Secondary | ICD-10-CM | POA: Diagnosis not present

## 2023-05-03 DIAGNOSIS — R110 Nausea: Secondary | ICD-10-CM | POA: Diagnosis not present

## 2023-05-03 DIAGNOSIS — K625 Hemorrhage of anus and rectum: Secondary | ICD-10-CM | POA: Diagnosis not present

## 2023-05-03 LAB — SURGICAL PATHOLOGY

## 2023-05-12 ENCOUNTER — Other Ambulatory Visit: Payer: Self-pay | Admitting: Family Medicine

## 2023-05-12 DIAGNOSIS — K219 Gastro-esophageal reflux disease without esophagitis: Secondary | ICD-10-CM

## 2023-05-14 ENCOUNTER — Other Ambulatory Visit: Payer: Self-pay | Admitting: Gastroenterology

## 2023-05-14 DIAGNOSIS — R7989 Other specified abnormal findings of blood chemistry: Secondary | ICD-10-CM

## 2023-05-15 ENCOUNTER — Encounter (HOSPITAL_BASED_OUTPATIENT_CLINIC_OR_DEPARTMENT_OTHER): Payer: Self-pay

## 2023-05-15 ENCOUNTER — Ambulatory Visit (HOSPITAL_BASED_OUTPATIENT_CLINIC_OR_DEPARTMENT_OTHER)
Admission: RE | Admit: 2023-05-15 | Discharge: 2023-05-15 | Disposition: A | Discharge status: Home / Self Care | Payer: 59 | Source: Ambulatory Visit | Attending: Obstetrics & Gynecology | Admitting: Obstetrics & Gynecology

## 2023-05-15 DIAGNOSIS — Z1231 Encounter for screening mammogram for malignant neoplasm of breast: Secondary | ICD-10-CM | POA: Diagnosis not present

## 2023-05-15 NOTE — Progress Notes (Signed)
GYNECOLOGY  VISIT  CC:   vulvar biopsies/excision of lesions  HPI: 52 y.o. [id number] Divorced White or Caucasian female here for excision of vulvar lesions/biopsies due to multiple pigmented appearing lesions.  Pt does smoke as well.  Pt was last seen 09/2021 and these lesions are new so feel should proceed with biopsy/excision.   Past Medical History:  Diagnosis Date   Allergy    Anemia    Anxiety    Arthritis    Chronic headaches    Depression    GERD (gastroesophageal reflux disease)     MEDS:   Current Outpatient Medications on File Prior to Visit  Medication Sig Dispense Refill   acetaminophen (TYLENOL) 650 MG CR tablet Take 2 tablets by mouth daily.     ALPRAZolam (XANAX) 1 MG tablet Take 1 tablet by mouth daily.     DULoxetine (CYMBALTA) 30 MG capsule Take 1 capsule (30 mg total) by mouth daily. 30 capsule 5   famotidine (PEPCID) 20 MG tablet TAKE 1 TABLET BY MOUTH AT BEDTIME 90 tablet 0   gabapentin (NEURONTIN) 300 MG capsule Take 1 capsule in AM and 3 capsules nightly 120 capsule 12   promethazine (PHENERGAN) 25 MG tablet Take 1 tablet by mouth every 6 (six) hours as needed.     traMADol (ULTRAM) 50 MG tablet 1-2 every 4 hours as needed for cough or pain 40 tablet 0   No current facility-administered medications on file prior to visit.    ALLERGIES: Sulfa antibiotics and Vicodin [hydrocodone-acetaminophen]  SH:  divorced, non smoker  Review of Systems  Constitutional: Negative.   Genitourinary: Negative.     PHYSICAL EXAMINATION:    BP (!) 146/69 (BP Location: Right Arm, Patient Position: Sitting, Cuff Size: Large)   Pulse 87   Ht 5' 4.5" (1.638 m) Comment: Reported  Wt 179 lb (81.2 kg)   BMI 30.25 kg/m     Physical Exam Constitutional:      Appearance: Normal appearance.  Genitourinary:   Neurological:     General: No focal deficit present.     Mental Status: She is alert.  Psychiatric:        Mood and Affect: Mood normal.        Judgment: Judgment  normal.    Procedure:  Areas cleansed with Betadine.  Sterile technique used throughout procedure.  Skin anesthestized with Lidocaine 1% plain; 4mL.  Using #11 blade and with shave biopsy technique, the lesions at location #1 and #2 were fully excised.  Then a representative biopsy of the pigmented lesions near the anus was biopsied using a  3mm punch biopsy.  This biopsy was grasped with pick-ups and excised with scissors.  Adequate hemostasis obtained with silver nitrate sticks.  Dressing was not applied.  Pt tolerated procedure well.  Chaperone, Ina Homes, CMA, was present for exam.  Assessment/Plan: 1. Vulvar lesions - care for cleaning discussed.  Lesions will be sent to pathology and results called to pt. - Surgical pathology( [facility])

## 2023-05-16 ENCOUNTER — Encounter (HOSPITAL_BASED_OUTPATIENT_CLINIC_OR_DEPARTMENT_OTHER): Payer: Self-pay

## 2023-05-16 ENCOUNTER — Ambulatory Visit (INDEPENDENT_AMBULATORY_CARE_PROVIDER_SITE_OTHER): Payer: 59

## 2023-05-16 VITALS — BP 139/77 | Ht 64.5 in | Wt 178.2 lb

## 2023-05-16 DIAGNOSIS — R300 Dysuria: Secondary | ICD-10-CM

## 2023-05-16 DIAGNOSIS — N390 Urinary tract infection, site not specified: Secondary | ICD-10-CM

## 2023-05-16 LAB — POCT URINALYSIS DIPSTICK
Bilirubin, UA: NEGATIVE
Glucose, UA: POSITIVE — AB
Ketones, UA: NEGATIVE
Nitrite, UA: POSITIVE
Protein, UA: NEGATIVE
Spec Grav, UA: 1.01 (ref 1.010–1.025)
Urobilinogen, UA: 0.2 E.U./dL
pH, UA: 6.5 (ref 5.0–8.0)

## 2023-05-16 MED ORDER — NITROFURANTOIN MONOHYD MACRO 100 MG PO CAPS
100.0000 mg | ORAL_CAPSULE | Freq: Two times a day (BID) | ORAL | 0 refills | Status: DC
Start: 2023-05-16 — End: 2023-05-30

## 2023-05-16 MED ORDER — PHENAZOPYRIDINE HCL 200 MG PO TABS
200.0000 mg | ORAL_TABLET | Freq: Three times a day (TID) | ORAL | 0 refills | Status: DC | PRN
Start: 2023-05-16 — End: 2024-05-15

## 2023-05-16 NOTE — Progress Notes (Signed)
Patient came in today with complaints of burning while urinating and urinary urgency. Urine was checked and the medication protocol was called in for patient. Macrobid 500mg  BID X5days and Pyridium 200mg  TID X2 days. Urine sent out for culture. tbw

## 2023-05-22 LAB — URINE CULTURE

## 2023-05-23 ENCOUNTER — Ambulatory Visit
Admission: RE | Admit: 2023-05-23 | Discharge: 2023-05-23 | Disposition: A | Discharge status: Home / Self Care | Payer: 59 | Source: Ambulatory Visit | Attending: Gastroenterology | Admitting: Gastroenterology

## 2023-05-23 DIAGNOSIS — K760 Fatty (change of) liver, not elsewhere classified: Secondary | ICD-10-CM | POA: Diagnosis not present

## 2023-05-23 DIAGNOSIS — R7989 Other specified abnormal findings of blood chemistry: Secondary | ICD-10-CM

## 2023-05-23 DIAGNOSIS — K838 Other specified diseases of biliary tract: Secondary | ICD-10-CM | POA: Diagnosis not present

## 2023-05-25 ENCOUNTER — Other Ambulatory Visit (HOSPITAL_COMMUNITY)
Admission: RE | Admit: 2023-05-25 | Discharge: 2023-05-25 | Disposition: A | Discharge status: Home / Self Care | Payer: 59 | Source: Ambulatory Visit | Attending: Obstetrics & Gynecology | Admitting: Obstetrics & Gynecology

## 2023-05-25 ENCOUNTER — Ambulatory Visit (INDEPENDENT_AMBULATORY_CARE_PROVIDER_SITE_OTHER): Payer: 59 | Admitting: *Deleted

## 2023-05-25 ENCOUNTER — Telehealth (HOSPITAL_BASED_OUTPATIENT_CLINIC_OR_DEPARTMENT_OTHER): Payer: Self-pay | Admitting: *Deleted

## 2023-05-25 VITALS — BP 123/83 | HR 82

## 2023-05-25 DIAGNOSIS — N898 Other specified noninflammatory disorders of vagina: Secondary | ICD-10-CM | POA: Diagnosis not present

## 2023-05-25 DIAGNOSIS — R300 Dysuria: Secondary | ICD-10-CM

## 2023-05-25 LAB — POCT URINALYSIS DIPSTICK
Appearance: NORMAL
Bilirubin, UA: NEGATIVE
Blood, UA: NEGATIVE
Glucose, UA: NEGATIVE
Ketones, UA: NEGATIVE
Nitrite, UA: NEGATIVE
Protein, UA: NEGATIVE
Spec Grav, UA: 1.02 (ref 1.010–1.025)
Urobilinogen, UA: 0.2 E.U./dL
pH, UA: 7 (ref 5.0–8.0)

## 2023-05-25 NOTE — Progress Notes (Signed)
Pt presents with complaints of burning with urination and some irritation after completion of treatment for uti last week. Clean catch urine obtained. Pt instructed on and performed self swab to rule out yeast infection from recent antibiotics. Advised that we would be in contact with her once results are received.

## 2023-05-25 NOTE — Telephone Encounter (Signed)
Vm from pt UTI Sx's after finishing ABX given on 7/10 of Macrobid.   I called patient,verified DOB, and asked what symptoms she was having.  Pearlena stated felt somewhat better towards the end of the prescription then restarted with burning w/ urination and frequency.  Pt advised nurse visit for UA check prior to retreat.  Pt understood. Apt today for nurse visit 1045am. KW CMA

## 2023-05-27 LAB — URINE CULTURE: Organism ID, Bacteria: NO GROWTH

## 2023-05-28 LAB — CERVICOVAGINAL ANCILLARY ONLY
Bacterial Vaginitis (gardnerella): NEGATIVE
Candida Glabrata: NEGATIVE
Candida Vaginitis: NEGATIVE
Comment: NEGATIVE
Comment: NEGATIVE
Comment: NEGATIVE

## 2023-05-30 ENCOUNTER — Other Ambulatory Visit (HOSPITAL_BASED_OUTPATIENT_CLINIC_OR_DEPARTMENT_OTHER): Payer: Self-pay | Admitting: Obstetrics & Gynecology

## 2023-06-06 DIAGNOSIS — R194 Change in bowel habit: Secondary | ICD-10-CM | POA: Diagnosis not present

## 2023-06-06 DIAGNOSIS — Z1211 Encounter for screening for malignant neoplasm of colon: Secondary | ICD-10-CM | POA: Diagnosis not present

## 2023-06-06 DIAGNOSIS — K625 Hemorrhage of anus and rectum: Secondary | ICD-10-CM | POA: Diagnosis not present

## 2023-06-06 DIAGNOSIS — K648 Other hemorrhoids: Secondary | ICD-10-CM | POA: Diagnosis not present

## 2023-06-06 DIAGNOSIS — D125 Benign neoplasm of sigmoid colon: Secondary | ICD-10-CM | POA: Diagnosis not present

## 2023-06-06 DIAGNOSIS — K6389 Other specified diseases of intestine: Secondary | ICD-10-CM | POA: Diagnosis not present

## 2023-06-06 DIAGNOSIS — K635 Polyp of colon: Secondary | ICD-10-CM | POA: Diagnosis not present

## 2023-06-06 LAB — HM COLONOSCOPY

## 2023-08-25 IMAGING — MG MM DIGITAL SCREENING BILAT W/ TOMO AND CAD
8 series · 8 of 24 positions shown · non-contrast
Comparison: Previous exam(s).

CLINICAL DATA: Screening.

EXAM:
DIGITAL SCREENING BILATERAL MAMMOGRAM WITH TOMOSYNTHESIS AND CAD
TECHNIQUE: Bilateral screening digital craniocaudal and mediolateral oblique
mammograms were obtained. Bilateral screening digital breast
tomosynthesis was performed. The images were evaluated with
computer-aided detection.

[R CC synth-2D]
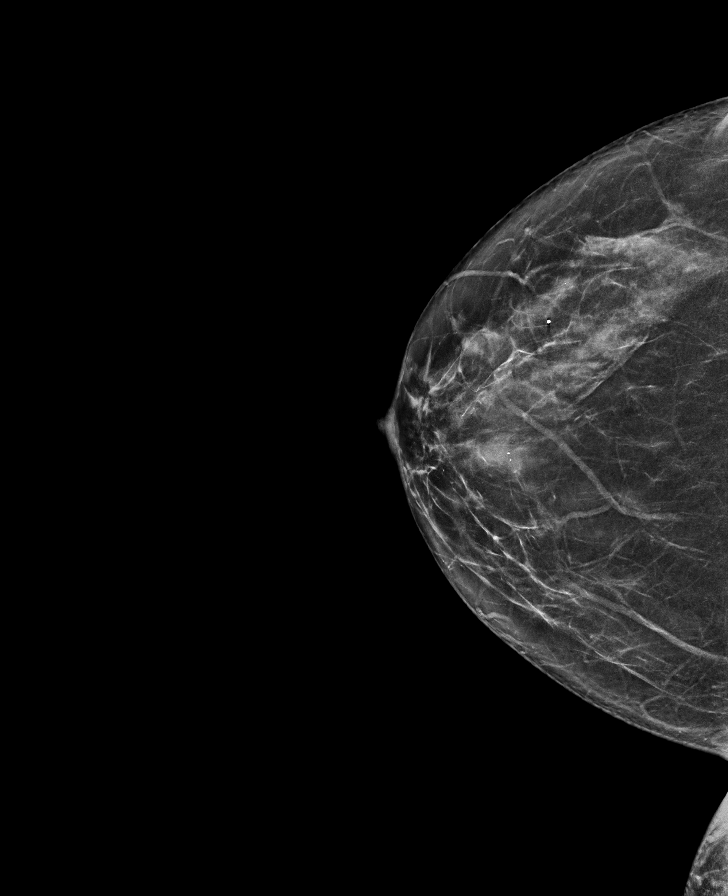

[R MLO synth-2D]
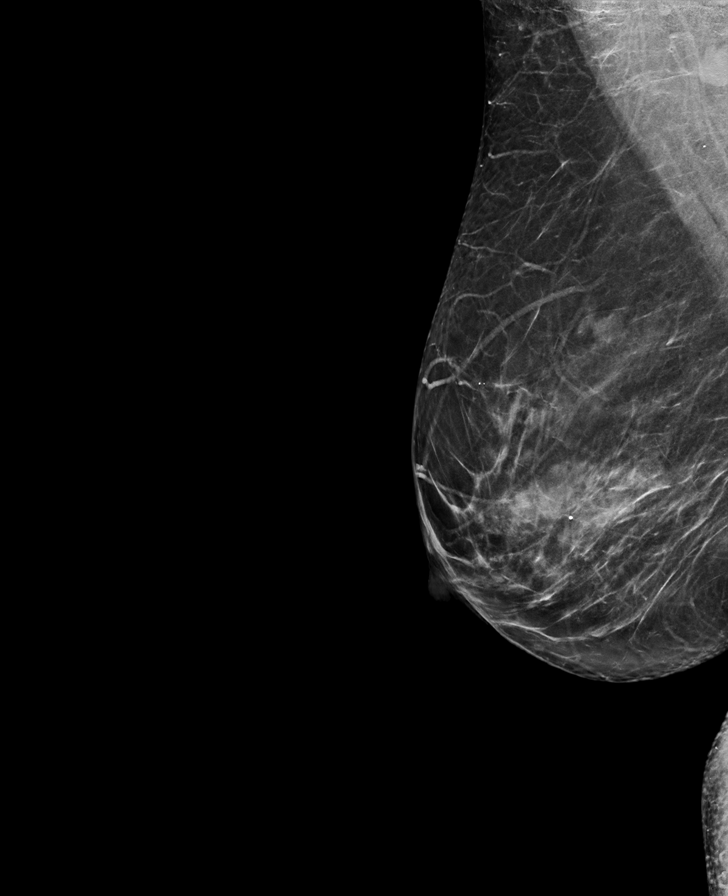

[L CC synth-2D]
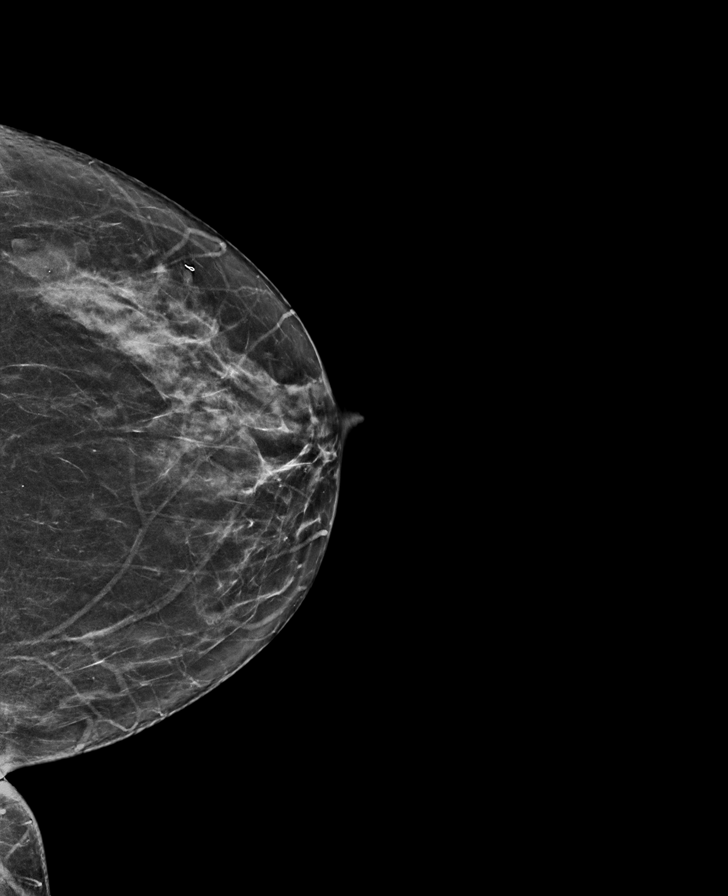

[L MLO synth-2D]
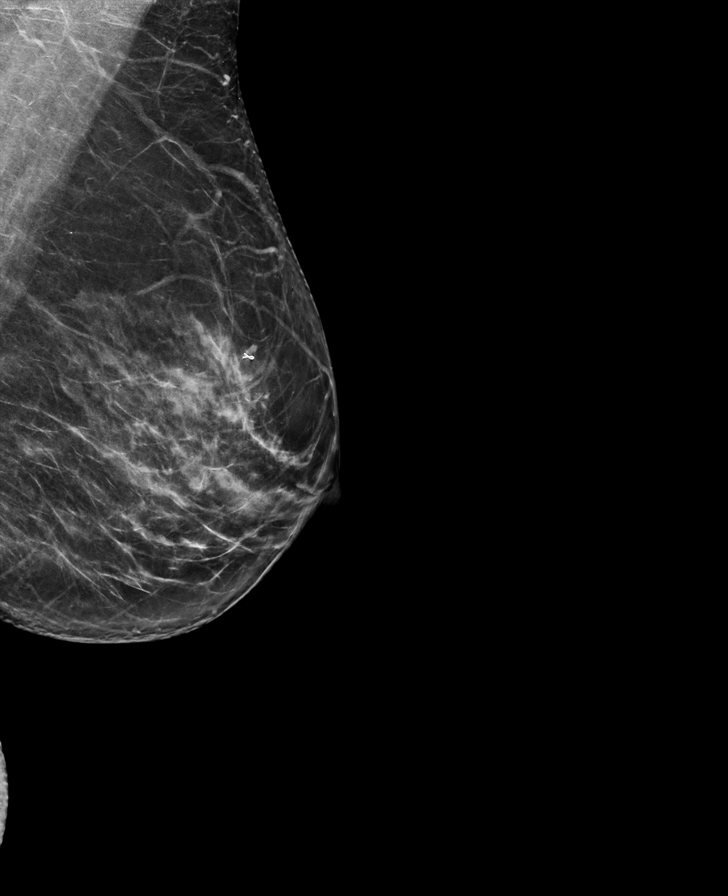

[R MLO tomo · tomo slice 39/77.0]
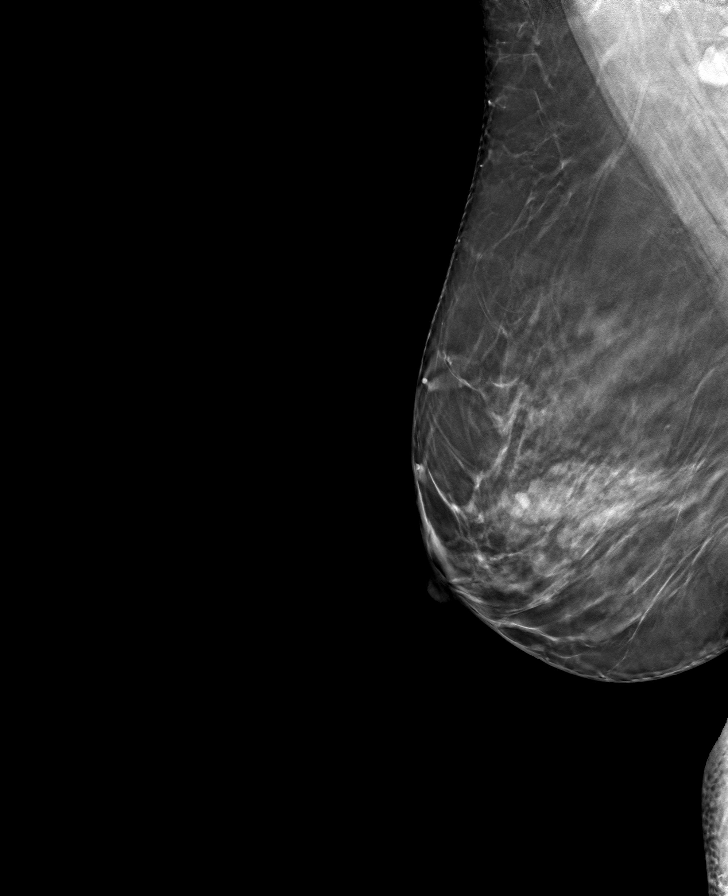

[L CC tomo · tomo slice 33/65.0]
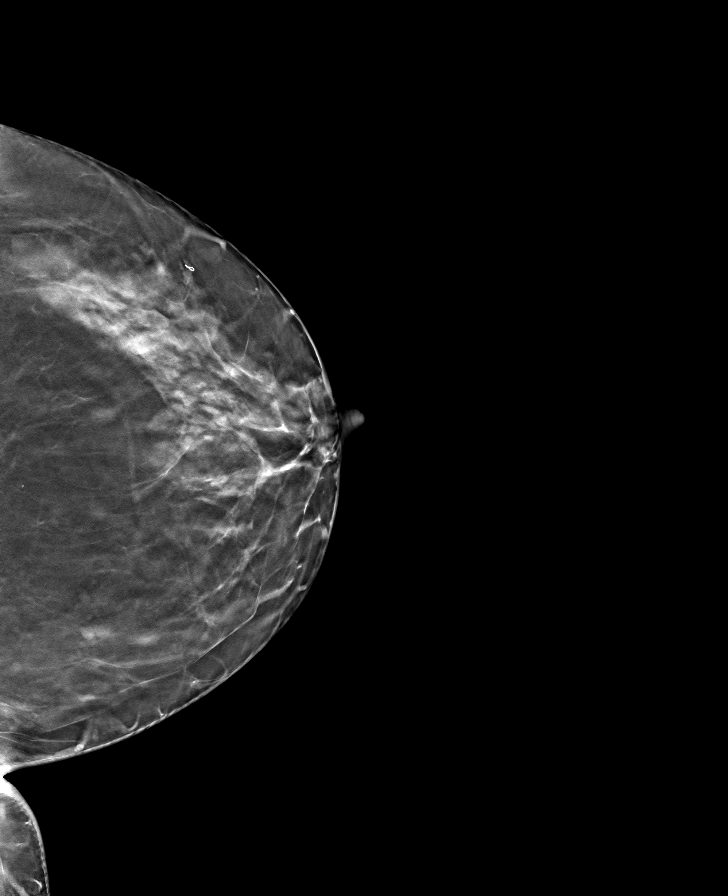

[R CC tomo · tomo slice 34/67.0]
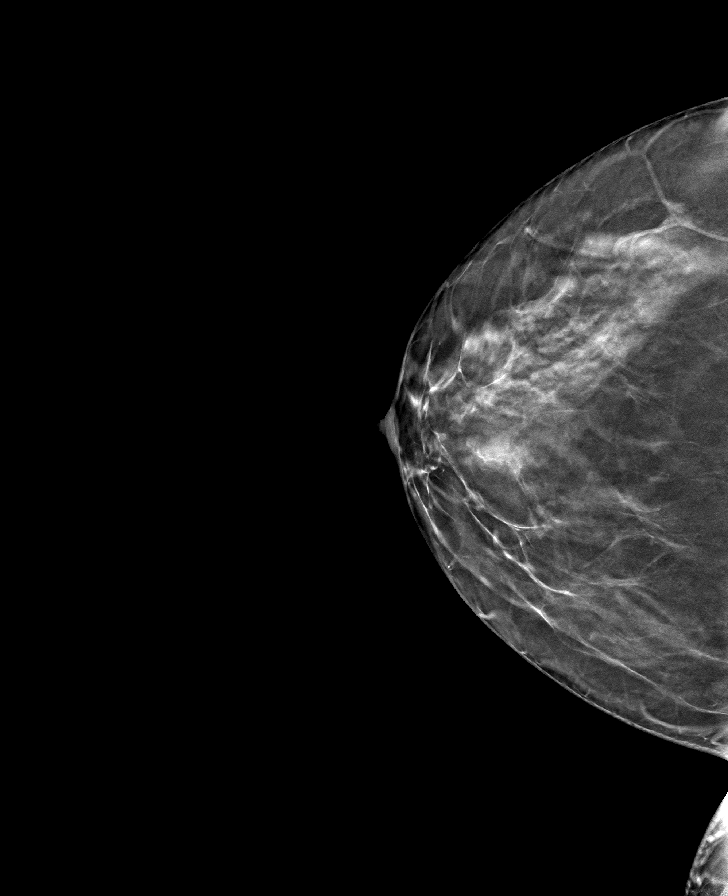

[L MLO tomo · tomo slice 37/73.0]
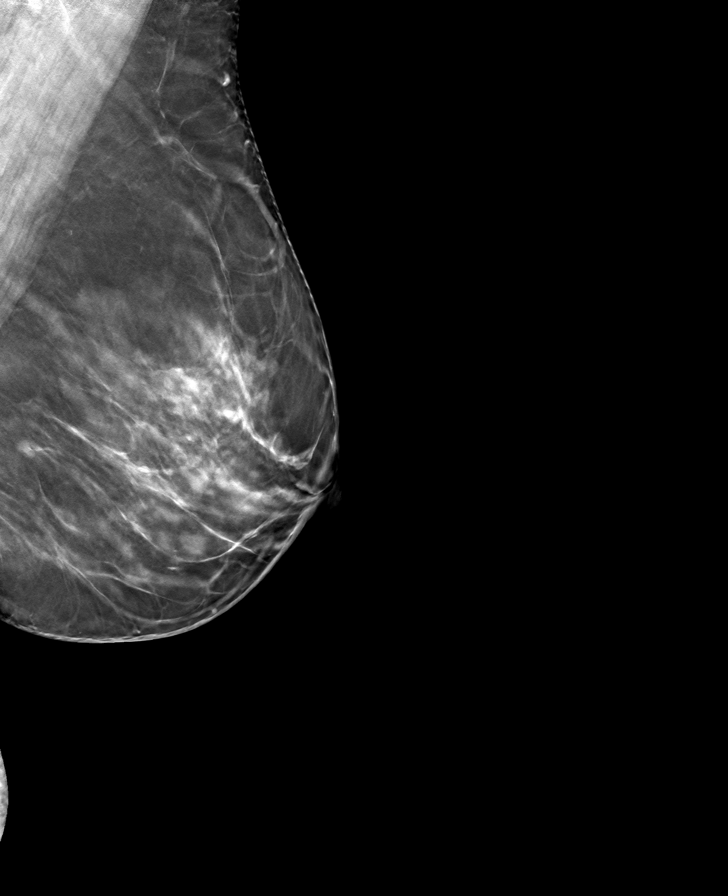

[8 of 24 positions shown; findings below may reference images not displayed]

ACR Breast Density Category b: There are scattered areas of
fibroglandular density.
FINDINGS: There are no findings suspicious for malignancy.
IMPRESSION: No mammographic evidence of malignancy. A result letter of this
screening mammogram will be mailed directly to the patient.

RECOMMENDATION:
Screening mammogram in one year. (Code:51-O-LD2)

BI-RADS CATEGORY  1: Negative.

## 2023-09-05 ENCOUNTER — Other Ambulatory Visit: Payer: Self-pay | Admitting: Family Medicine

## 2023-09-05 DIAGNOSIS — G894 Chronic pain syndrome: Secondary | ICD-10-CM

## 2023-09-05 DIAGNOSIS — F418 Other specified anxiety disorders: Secondary | ICD-10-CM

## 2023-09-06 NOTE — Telephone Encounter (Signed)
Last refill 08/07/2022 Last office visit 08/07/2022  Please advise

## 2023-09-06 NOTE — Progress Notes (Deleted)
Office Visit Note  Patient: Carly Wilson             Date of Birth: [birth year]           MRN: [id number]             PCP: Shade Flood, MD Referring: Jerene Bears, MD Visit Date: 09/20/2023 Occupation: @GUAROCC @  Subjective:  No chief complaint on file.   History of Present Illness: Carly Wilson is a 52 y.o. female ***     Activities of Daily Living:  Patient reports morning stiffness for *** {minute/hour:19697}.   Patient {ACTIONS;DENIES/REPORTS:21021675::"Denies"} nocturnal pain.  Difficulty dressing/grooming: {ACTIONS;DENIES/REPORTS:21021675::"Denies"} Difficulty climbing stairs: {ACTIONS;DENIES/REPORTS:21021675::"Denies"} Difficulty getting out of chair: {ACTIONS;DENIES/REPORTS:21021675::"Denies"} Difficulty using hands for taps, buttons, cutlery, and/or writing: {ACTIONS;DENIES/REPORTS:21021675::"Denies"}  No Rheumatology ROS completed.   PMFS History:  Patient Active Problem List   Diagnosis Date Noted   Rectal bleeding 09/08/2021   Hot flashes 09/08/2021   Pain in joint, multiple sites 09/08/2021   Upper airway cough syndrome vs cough variant asthma 05/25/2014   Smoker 05/25/2014    Past Medical History:  Diagnosis Date   Allergy    Anemia    Anxiety    Arthritis    Chronic headaches    Depression    GERD (gastroesophageal reflux disease)     Family History  Problem Relation Age of Onset   Miscarriages / [place] Mother    Cancer Mother    Arthritis Mother    Allergies Mother    Hypertension Father    Hyperlipidemia Father    Drug abuse Father    COPD Father    Cancer Father    Heart disease Maternal Grandmother    COPD Maternal Grandmother    Breast cancer Maternal Grandmother    COPD Maternal Grandfather    Cancer Maternal Grandfather        "bile duct"   Rheum arthritis Paternal Grandmother    COPD Paternal Grandfather    Emphysema Paternal Grandfather    Past Surgical History:  Procedure Laterality Date    APPENDECTOMY  11/06/1988   CARPAL TUNNEL RELEASE Left 1996   wtih ganglion cyst removal   LUMBAR FUSION  2011   MICRODISCECTOMY LUMBAR  2001   Social History   Social History Narrative   Not on file   Immunization History  Administered Date(s) Administered   PFIZER(Purple Top)SARS-COV-2 Vaccination 02/13/2020, 03/05/2020   Tdap 11/25/2008   Zoster Recombinant(Shingrix) 08/07/2022     Objective: Vital Signs: There were no vitals taken for this visit.   Physical Exam   Musculoskeletal Exam: ***  CDAI Exam: CDAI Score: -- Patient Global: --; Provider Global: -- Swollen: --; Tender: -- Joint Exam 09/20/2023   No joint exam has been documented for this visit   There is currently no information documented on the homunculus. Go to the Rheumatology activity and complete the homunculus joint exam.  Investigation: No additional findings.  Imaging: No results found.  Recent Labs: Lab Results  Component Value Date   WBC 8.8 03/29/2022   HGB 15.1 (H) 03/29/2022   PLT 129.0 (L) 03/29/2022   NA 141 09/06/2021   K 4.4 09/06/2021   CL 103 09/06/2021   CO2 23 09/06/2021   GLUCOSE 91 09/06/2021   BUN 9 09/06/2021   CREATININE 0.70 09/06/2021   BILITOT 0.3 09/06/2021   ALKPHOS 61 09/06/2021   AST 16 09/06/2021   ALT 19 09/06/2021   PROT 6.8 09/06/2021   ALBUMIN 4.8 09/06/2021  CALCIUM 10.2 09/06/2021   September 06, 2021 ESR 12, ANA negative, RF negative  Speciality Comments: No specialty comments available.  Procedures:  No procedures performed Allergies: Sulfa antibiotics and Vicodin [hydrocodone-acetaminophen]   Assessment / Plan:     Visit Diagnoses: No diagnosis found.  Orders: No orders of the defined types were placed in this encounter.  No orders of the defined types were placed in this encounter.   Face-to-face time spent with patient was *** minutes. Greater than 50% of time was spent in counseling and coordination of care.  Follow-Up  Instructions: No follow-ups on file.   Pollyann Savoy, MD  Note - This record has been created using Animal nutritionist.  Chart creation errors have been sought, but may not always  have been located. Such creation errors do not reflect on  the standard of medical care.

## 2023-09-06 NOTE — Telephone Encounter (Signed)
Last visit with me in October 2023.  Will temporarily refill Cymbalta, please schedule visit.  Do not combine Cymbalta with tramadol, that appears to be an old prescription.  Discontinued from medication list but make sure she is not still taking that med.

## 2023-09-20 ENCOUNTER — Encounter: Payer: 59 | Admitting: Rheumatology

## 2023-09-20 DIAGNOSIS — F17200 Nicotine dependence, unspecified, uncomplicated: Secondary | ICD-10-CM

## 2023-09-20 DIAGNOSIS — M2550 Pain in unspecified joint: Secondary | ICD-10-CM

## 2023-09-20 DIAGNOSIS — K625 Hemorrhage of anus and rectum: Secondary | ICD-10-CM

## 2023-09-20 DIAGNOSIS — K760 Fatty (change of) liver, not elsewhere classified: Secondary | ICD-10-CM

## 2023-09-20 DIAGNOSIS — R058 Other specified cough: Secondary | ICD-10-CM

## 2023-10-07 ENCOUNTER — Other Ambulatory Visit: Payer: Self-pay | Admitting: Family Medicine

## 2023-10-07 DIAGNOSIS — F418 Other specified anxiety disorders: Secondary | ICD-10-CM

## 2023-10-07 DIAGNOSIS — G894 Chronic pain syndrome: Secondary | ICD-10-CM

## 2023-10-11 ENCOUNTER — Other Ambulatory Visit: Payer: Self-pay | Admitting: Family Medicine

## 2023-10-11 DIAGNOSIS — R058 Other specified cough: Secondary | ICD-10-CM

## 2023-10-11 DIAGNOSIS — K219 Gastro-esophageal reflux disease without esophagitis: Secondary | ICD-10-CM

## 2023-10-18 ENCOUNTER — Other Ambulatory Visit: Payer: Self-pay

## 2023-10-18 DIAGNOSIS — F418 Other specified anxiety disorders: Secondary | ICD-10-CM

## 2023-10-18 DIAGNOSIS — G894 Chronic pain syndrome: Secondary | ICD-10-CM

## 2023-10-18 MED ORDER — DULOXETINE HCL 30 MG PO CPEP
30.0000 mg | ORAL_CAPSULE | Freq: Every day | ORAL | 0 refills | Status: DC
Start: 2023-10-18 — End: 2023-10-22

## 2023-10-22 ENCOUNTER — Ambulatory Visit: Payer: 59 | Admitting: Family Medicine

## 2023-10-22 VITALS — BP 126/70 | HR 77 | Temp 98.0°F | Ht 64.5 in | Wt 167.4 lb

## 2023-10-22 DIAGNOSIS — Z8261 Family history of arthritis: Secondary | ICD-10-CM

## 2023-10-22 DIAGNOSIS — M2550 Pain in unspecified joint: Secondary | ICD-10-CM

## 2023-10-22 DIAGNOSIS — R058 Other specified cough: Secondary | ICD-10-CM | POA: Diagnosis not present

## 2023-10-22 DIAGNOSIS — R232 Flushing: Secondary | ICD-10-CM | POA: Diagnosis not present

## 2023-10-22 DIAGNOSIS — Z23 Encounter for immunization: Secondary | ICD-10-CM | POA: Diagnosis not present

## 2023-10-22 DIAGNOSIS — F418 Other specified anxiety disorders: Secondary | ICD-10-CM

## 2023-10-22 DIAGNOSIS — Z122 Encounter for screening for malignant neoplasm of respiratory organs: Secondary | ICD-10-CM | POA: Diagnosis not present

## 2023-10-22 DIAGNOSIS — G894 Chronic pain syndrome: Secondary | ICD-10-CM

## 2023-10-22 DIAGNOSIS — Z1159 Encounter for screening for other viral diseases: Secondary | ICD-10-CM | POA: Diagnosis not present

## 2023-10-22 DIAGNOSIS — K219 Gastro-esophageal reflux disease without esophagitis: Secondary | ICD-10-CM

## 2023-10-22 DIAGNOSIS — Z114 Encounter for screening for human immunodeficiency virus [HIV]: Secondary | ICD-10-CM

## 2023-10-22 MED ORDER — FAMOTIDINE 20 MG PO TABS: 20.0000 mg | ORAL_TABLET | Freq: Every day | ORAL | 0 refills | Status: DC

## 2023-10-22 MED ORDER — ALPRAZOLAM 1 MG PO TABS: 0.5000 mg | ORAL_TABLET | Freq: Every day | ORAL | 0 refills | Status: DC | PRN

## 2023-10-22 MED ORDER — DULOXETINE HCL 30 MG PO CPEP
30.0000 mg | ORAL_CAPSULE | Freq: Every day | ORAL | 1 refills | Status: AC
Start: 2023-10-22 — End: ?

## 2023-10-22 MED ORDER — OMEPRAZOLE 20 MG PO CPDR
20.0000 mg | DELAYED_RELEASE_CAPSULE | Freq: Every day | ORAL | 0 refills | Status: DC
Start: 2023-10-22 — End: 2024-04-14

## 2023-10-22 NOTE — Patient Instructions (Addendum)
Continue Cymbalta 30 mg/day.  I did refill alprazolam but use that only if needed and lowest effective dose, start with half a pill if possible.  I will refer you to psychiatry to discuss depression and anxiety symptoms further and possible medication changes.  See information below on managing depression, anxiety symptoms for now.  You may also want to check with your insurance to see if there may be a lower cost counseling option based on your plan.  Let me know if I can help further.  Congratulations on decreased alcohol use, keep up the good work!  No change in heartburn medications at this time, keep follow-up with OB/GYN regarding hot flushes and treatments.  If any worsening pain, I would recommend follow-up with previous pain management practice.  Let me know if you have questions.  We will refer you for lung cancer screening, and I did check hepatitis C and HIV screening today for health maintenance.  Second shingles vaccine given today as well as Tdap.  Follow-up with me in 6 months but let me know if there are any questions or concerns in the meantime.  Take care!  Managing Depression, Adult Depression is a mental health condition that affects your thoughts, feelings, and actions. Being diagnosed with depression can bring you relief if you did not know why you have felt or behaved a certain way. It could also leave you feeling overwhelmed. Finding ways to manage your symptoms can help you feel more positive about your future. How to manage lifestyle changes Being depressed is difficult. Depression can increase the level of everyday stress. Stress can make depression symptoms worse. You may believe your symptoms cannot be managed or will never improve. However, there are many things you can try to help manage your symptoms. There is hope. Managing stress  Stress is your body's reaction to life changes and events, both good and bad. Stress can add to your feelings of depression. Learning to  manage your stress can help lessen your feelings of depression. Try some of the following approaches to reducing your stress (stress reduction techniques): Listen to music that you enjoy and that inspires you. Try using a meditation app or take a meditation class. Develop a practice that helps you connect with your spiritual self. Walk in nature, pray, or go to a place of worship. Practice deep breathing. To do this, inhale slowly through your nose. Pause at the top of your inhale for a few seconds and then exhale slowly, letting yourself relax. Repeat this three or four times. Practice yoga to help relax and work your muscles. Choose a stress reduction technique that works for you. These techniques take time and practice to develop. Set aside 5-15 minutes a day to do them. Therapists can offer training in these techniques. Do these things to help manage stress: Keep a journal. Know your limits. Set healthy boundaries for yourself and others, such as saying "no" when you think something is too much. Pay attention to how you react to certain situations. You may not be able to control everything, but you can change your reaction. Add humor to your life by watching funny movies or shows. Make time for activities that you enjoy and that relax you. Spend less time using electronics, especially at night before bed. The light from screens can make your brain think it is time to get up rather than go to bed.  Medicines Medicines, such as antidepressants, are often a part of treatment for depression. Talk with  your pharmacist or health care provider about all the medicines, supplements, and herbal products that you take, their possible side effects, and what medicines and other products are safe to take together. Make sure to report any side effects you may have to your health care provider. Relationships Your health care provider may suggest family therapy, couples therapy, or individual therapy as part  of your treatment. How to recognize changes Everyone responds differently to treatment for depression. As you recover from depression, you may start to: Have more interest in doing activities. Feel more hopeful. Have more energy. Eat a more regular amount of food. Have better mental focus. It is important to recognize if your depression is not getting better or is getting worse. The symptoms you had in the beginning may return, such as: Feeling tired. Eating too much or too little. Sleeping too much or too little. Feeling restless, agitated, or hopeless. Trouble focusing or making decisions. Having unexplained aches and pains. Feeling irritable, angry, or aggressive. If you or your family members notice these symptoms coming back, let your health care provider know right away. Follow these instructions at home: Activity Try to get some form of exercise each day, such as walking. Try yoga, mindfulness, or other stress reduction techniques. Participate in group activities if you are able. Lifestyle Get enough sleep. Cut down on or stop using caffeine, tobacco, alcohol, and any other harmful substances. Eat a healthy diet that includes plenty of vegetables, fruits, whole grains, low-fat dairy products, and lean protein. Limit foods that are high in solid fats, added sugar, or salt (sodium). General instructions Take over-the-counter and prescription medicines only as told by your health care provider. Keep all follow-up visits. It is important for your health care provider to check on your mood, behavior, and medicines. Your health care provider may need to make changes to your treatment. Where to find support Talking to others  Friends and family members can be sources of support and guidance. Talk to trusted friends or family members about your condition. Explain your symptoms and let them know that you are working with a health care provider to treat your depression. Tell friends and  family how they can help. Finances Find mental health providers that fit with your financial situation. Talk with your health care provider if you are worried about access to food, housing, or medicine. Call your insurance company to learn about your co-pays and prescription plan. Where to find more information You can find support in your area from: Anxiety and Depression Association of America (ADAA): adaa.org Mental Health America: mentalhealthamerica.net The First American on Mental Illness: nami.org Contact a health care provider if: You stop taking your antidepressant medicines, and you have any of these symptoms: Nausea. Headache. Light-headedness. Chills and body aches. Not being able to sleep (insomnia). You or your friends and family think your depression is getting worse. Get help right away if: You have thoughts of hurting yourself or others. Get help right away if you feel like you may hurt yourself or others, or have thoughts about taking your own life. Go to your nearest emergency room or: Call 911. Call the National Suicide Prevention Lifeline at (423)787-6408 or 988. This is open 24 hours a day. Text the Crisis Text Line at 531-689-7555. This information is not intended to replace advice given to you by your health care provider. Make sure you discuss any questions you have with your health care provider. Document Revised: 02/28/2022 Document Reviewed: 02/28/2022 Elsevier Patient Education  2024 Elsevier Inc.   Managing Anxiety, Adult After being diagnosed with anxiety, you may be relieved to know why you have felt or behaved a certain way. You may also feel overwhelmed about the treatment ahead and what it will mean for your life. With care and support, you can manage your anxiety. How to manage lifestyle changes Understanding the difference between stress and anxiety Although stress can play a role in anxiety, it is not the same as anxiety. Stress is your body's  reaction to life changes and events, both good and bad. Stress is often caused by something external, such as a deadline, test, or competition. It normally goes away after the event has ended and will last just a few hours. But, stress can be ongoing and can lead to more than just stress. Anxiety is caused by something internal, such as imagining a terrible outcome or worrying that something will go wrong that will greatly upset you. Anxiety often does not go away even after the event is over, and it can become a long-term (chronic) worry. Lowering stress and anxiety Talk with your health care provider or a counselor to learn more about lowering anxiety and stress. They may suggest tension-reduction techniques, such as: Music. Spend time creating or listening to music that you enjoy and that inspires you. Mindfulness-based meditation. Practice being aware of your normal breaths while not trying to control your breathing. It can be done while sitting or walking. Centering prayer. Focus on a word, phrase, or sacred image that means something to you and brings you peace. Deep breathing. Expand your stomach and inhale slowly through your nose. Hold your breath for 3-5 seconds. Then breathe out slowly, letting your stomach muscles relax. Self-talk. Learn to notice and spot thought patterns that lead to anxiety reactions. Change those patterns to thoughts that feel peaceful. Muscle relaxation. Take time to tense muscles and then relax them. Choose a tension-reduction technique that fits your lifestyle and personality. These techniques take time and practice. Set aside 5-15 minutes a day to do them. Specialized therapists can offer counseling and training in these techniques. The training to help with anxiety may be covered by some insurance plans. Other things you can do to manage stress and anxiety include: Keeping a stress diary. This can help you learn what triggers your reaction and then learn ways to  manage your response. Thinking about how you react to certain situations. You may not be able to control everything, but you can control your response. Making time for activities that help you relax and not feeling guilty about spending your time in this way. Doing visual imagery. This involves imagining or creating mental pictures to help you relax. Practicing yoga. Through yoga poses, you can lower tension and relax.  Medicines Medicines for anxiety include: Antidepressant medicines. These are usually prescribed for long-term daily control. Anti-anxiety medicines. These may be added in severe cases, especially when panic attacks occur. When used together, medicines, psychotherapy, and tension-reduction techniques may be the most effective treatment. Relationships Relationships can play a big part in helping you recover. Spend more time connecting with trusted friends and family members. Think about going to couples counseling if you have a partner, taking family education classes, or going to family therapy. Therapy can help you and others better understand your anxiety. How to recognize changes in your anxiety Everyone responds differently to treatment for anxiety. Recovery from anxiety happens when symptoms lessen and stop interfering with your daily life at home or  work. This may mean that you will start to: Have better concentration and focus. Worry will interfere less in your daily thinking. Sleep better. Be less irritable. Have more energy. Have improved memory. Try to recognize when your condition is getting worse. Contact your provider if your symptoms interfere with home or work and you feel like your condition is not improving. Follow these instructions at home: Activity Exercise. Adults should: Exercise for at least 150 minutes each week. The exercise should increase your heart rate and make you sweat (moderate-intensity exercise). Do strengthening exercises at least twice a  week. Get the right amount and quality of sleep. Most adults need 7-9 hours of sleep each night. Lifestyle  Eat a healthy diet that includes plenty of vegetables, fruits, whole grains, low-fat dairy products, and lean protein. Do not eat a lot of foods that are high in fats, added sugars, or salt (sodium). Make choices that simplify your life. Do not use any products that contain nicotine or tobacco. These products include cigarettes, chewing tobacco, and vaping devices, such as e-cigarettes. If you need help quitting, ask your provider. Avoid caffeine, alcohol, and certain over-the-counter cold medicines. These may make you feel worse. Ask your pharmacist which medicines to avoid. General instructions Take over-the-counter and prescription medicines only as told by your provider. Keep all follow-up visits. This is to make sure you are managing your anxiety well or if you need more support. Where to find support You can get help and support from: Self-help groups. Online and [employer]. A trusted spiritual leader. Couples counseling. Family education classes. Family therapy. Where to find more information You may find that joining a support group helps you deal with your anxiety. The following sources can help you find counselors or support groups near you: Mental Health America: mentalhealthamerica.net Anxiety and Depression Association of [place] (ADAA): adaa.org The First American on Mental Illness (NAMI): nami.org Contact a health care provider if: You have a hard time staying focused or finishing tasks. You spend many hours a day feeling worried about everyday life. You are very tired because you cannot stop worrying. You start to have headaches or often feel tense. You have chronic nausea or diarrhea. Get help right away if: Your heart feels like it is racing. You have shortness of breath. You have thoughts of hurting yourself or others. Get help right away if you  feel like you may hurt yourself or others, or have thoughts about taking your own life. Go to your nearest emergency room or: Call 911. Call the National Suicide Prevention Lifeline at 607-786-7428 or 988. This is open 24 hours a day. Text the Crisis Text Line at (539)456-6128. This information is not intended to replace advice given to you by your health care provider. Make sure you discuss any questions you have with your health care provider. Document Revised: 08/01/2022 Document Reviewed: 02/13/2021 Elsevier Patient Education  2024 [employer].

## 2023-10-22 NOTE — Progress Notes (Signed)
Subjective:  Patient ID: Carly Wilson, female    DOB: 1971-04-23  Age: 53 y.o. MRN: [id number]  CC:  Chief Complaint  Patient presents with   Medical Management of Chronic Issues    Pt is doing well notes she is here to ensure refills     HPI Carly Wilson presents for   Depression with anxiety Last visit in October 2023.  History of chronic pain syndrome, but intolerant to higher doses of Cymbalta 60 mg.  Continued on 30 mg.  No benefit with Wellbutrin addition in the past and side effects with venlafaxine.  Has also been treated with gabapentin and it helps hot flashes as well as chronic pain.  Has seen pain management previously. Mood was stable when discussed last year on the 30 mg Cymbalta.  Infrequent need for Xanax, every week or 2.  She was advised to follow-up with pain management to see if she was assigned a different provider since her previous provider had left.  She has not followed up with pain mgt.  Wrist pain and chronic pain improved.  Still taking gabapentin 3 at night and 1 in am. No new side effects. Less nocturnal wakening. Has some hot flushes - plans to discuss with her GYN.  Cymbalta 30mg  every day. Ran out for a few days - breakthrough sx's off meds.  Managing ok with current dosing. No current therapy. Some social stressors. Self employed - Veterinary surgeon.  Counseling has been too costly. Unsure of her insurance plan coverage. Agreeable.  No SI/HI.  Xanax few times per month. Few left. Usually 1/2 pill does ok.  Controlled substance database reviewed.  Gabapentin No. 120 last filled on 09/14/2023, previously 08/14/2023, 07/08/2023.  Last alprazolam 1 mg #30 filled on 11/18/2021.     10/22/2023   10:23 AM 05/01/2023    4:27 PM 04/17/2023    4:13 PM 08/07/2022   11:31 AM 03/29/2022    9:13 AM  Depression screen PHQ 2/9  Decreased Interest 1 0 0 1 2  Down, Depressed, Hopeless 1 0 0 1 1  PHQ - 2 Score 2 0 0 2 3  Altered sleeping 2   0 3  Tired, decreased energy 1    1 1   Change in appetite 0   0 1  Feeling bad or failure about yourself  1   1 1   Trouble concentrating 2   2 1   Moving slowly or fidgety/restless 1   0 0  Suicidal thoughts 0   0 0  PHQ-9 Score 9   6 10       10/22/2023   10:23 AM 08/07/2022   11:32 AM  GAD 7 : Generalized Anxiety Score  Nervous, Anxious, on Edge 2 2  Control/stop worrying 2 1  Worry too much - different things 2 2  Trouble relaxing 1 1  Restless 1 0  Easily annoyed or irritable 3 1  Afraid - awful might happen 1 1  Total GAD 7 Score 12 8   Alcohol use Commended on decreased use at her October 2023 visit.  3-4 on some days, none on other days.  Denied need for assistance with decreased alcohol use at that time. No beer in past 3 months. 1-2 bourbon at night.  5-8 drinks total per week. Declines need for assistance at this time.    GERD Treated with omeprazole, Pepcid.  Recommended GI evaluation in the past with reported dark stools, but had not recurred at her visit in October  last year.  Some intermittent heartburn breakthrough at that time, recommended GI follow-up.  She has seen Dr. Loreta Ave. Doing ok on meds. Had elevated lfts - ultrasound with fatty liver - impetus for cutting back on alcohol.   Health maintenance Due for Hep C, HIV screening as well as Tdap and 2nd varicella vaccine..Also discussed lung cancer screening referral given tobacco use history (still smoking - 2ppd, smoker since age 68) agrees with these referrals, tests and vaccines.  History Patient Active Problem List   Diagnosis Date Noted   Rectal bleeding 09/08/2021   Hot flashes 09/08/2021   Pain in joint, multiple sites 09/08/2021   Upper airway cough syndrome vs cough variant asthma 05/25/2014   Smoker 05/25/2014   Past Medical History:  Diagnosis Date   Allergy    Anemia    Anxiety    Arthritis    Chronic headaches    Depression    GERD (gastroesophageal reflux disease)    Past Surgical History:  Procedure Laterality Date    APPENDECTOMY  11/06/1988   CARPAL TUNNEL RELEASE Left 1996   wtih ganglion cyst removal   LUMBAR FUSION  2011   MICRODISCECTOMY LUMBAR  2001   Allergies  Allergen Reactions   Sulfa Antibiotics     Nausea    Vicodin [Hydrocodone-Acetaminophen]     nausea   Prior to Admission medications   Medication Sig Start Date End Date Taking? Authorizing Provider  acetaminophen (TYLENOL) 650 MG CR tablet Take 2 tablets by mouth daily.   Yes [provider]  ALPRAZolam Prudy Feeler) 1 MG tablet Take 1 tablet by mouth daily. 01/17/21  Yes [provider]  budesonide (ENTOCORT EC) 3 MG 24 hr capsule Take 9 mg by mouth every morning.   Yes [provider]  DULoxetine (CYMBALTA) 30 MG capsule Take 1 capsule (30 mg total) by mouth daily. 10/18/23  Yes Shade Flood, MD  famotidine (PEPCID) 20 MG tablet TAKE 1 TABLET BY MOUTH AT BEDTIME 10/11/23  Yes Shade Flood, MD  gabapentin (NEURONTIN) 300 MG capsule Take 1 capsule in AM and 3 capsules nightly 04/17/23  Yes Jerene Bears, MD  omeprazole (PRILOSEC) 20 MG capsule Take 1 capsule by mouth once daily 10/11/23  Yes Shade Flood, MD  phenazopyridine (PYRIDIUM) 200 MG tablet Take 1 tablet (200 mg total) by mouth 3 (three) times daily as needed for pain. 05/16/23  Yes Jerene Bears, MD  promethazine (PHENERGAN) 25 MG tablet Take 1 tablet by mouth every 6 (six) hours as needed. 06/20/19  Yes [provider]   Social History   Socioeconomic History   Marital status: Divorced    Spouse name: Not on file   Number of children: Not on file   Years of education: Not on file   Highest education level: Not on file  Occupational History   Not on file  Tobacco Use   Smoking status: Every Day    Current packs/day: 1.00    Average packs/day: 1 pack/day for 26.0 years (26.0 ttl pk-yrs)    Types: Cigarettes   Smokeless tobacco: Never  Substance and Sexual Activity   Alcohol use: Yes    Comment: 12-42 beers a week   Drug  use: Yes    Types: Marijuana   Sexual activity: Yes    Birth control/protection: None  Other Topics Concern   Not on file  Social History Narrative   Not on file   Social Drivers of Corporate investment banker  Strain: Not on file  Food Insecurity: Not on file  Transportation Needs: Not on file  Physical Activity: Not on file  Stress: Not on file  Social Connections: Not on file  Intimate Partner Violence: Not on file    Review of Systems Per HPI  Objective:   Vitals:   10/22/23 1022  BP: 126/70  Pulse: 77  Temp: 98 F (36.7 C)  TempSrc: Temporal  SpO2: 98%  Weight: 167 lb 6.4 oz (75.9 kg)  Height: 5' 4.5" (1.638 m)     Physical Exam Vitals reviewed.  Constitutional:      Appearance: Normal appearance. She is well-developed.  HENT:     Head: Normocephalic and atraumatic.  Eyes:     Conjunctiva/sclera: Conjunctivae normal.     Pupils: Pupils are equal, round, and reactive to light.  Neck:     Vascular: No carotid bruit.  Cardiovascular:     Rate and Rhythm: Normal rate and regular rhythm.     Heart sounds: Normal heart sounds.  Pulmonary:     Effort: Pulmonary effort is normal.     Breath sounds: Normal breath sounds.  Abdominal:     Palpations: Abdomen is soft. There is no pulsatile mass.     Tenderness: There is no abdominal tenderness.  Musculoskeletal:     Right lower leg: No edema.     Left lower leg: No edema.  Skin:    General: Skin is warm and dry.  Neurological:     Mental Status: She is alert and oriented to person, place, and time.  Psychiatric:        Mood and Affect: Mood normal.        Behavior: Behavior normal.        Thought Content: Thought content normal.     Comments: Good eye contact, appropriate responses, does not appear to be responding to internal stimuli.  Denies SI/HI.        Assessment & Plan:  Carly Wilson is a 52 y.o. female . Depression with anxiety - Plan: DULoxetine (CYMBALTA) 30 MG capsule, Ambulatory  referral to Psychiatry -Some persistent symptoms as above and intolerant to some other attempted treatments.  Underlying chronic pain syndrome but reports that is overall stable at this time, also followed by GYN, treated with gabapentin for hot flashes which may also have some assistance with chronic pain management.  Given continued symptoms and prior medication intolerances will refer to psychiatry.  Option of pain management follow-up if flare in symptoms or insufficient control of pain.  Option of counseling also discussed but possibly cost prohibitive, recommended she discussed with her insurance plan to see if there may be preferred providers for decreased cost.   Chronic pain syndrome - Plan: DULoxetine (CYMBALTA) 30 MG capsule Pain in joint, multiple sites  -Continue Cymbalta, follow-up with pain management if needed as above.  Hot flashes  -On gabapentin as above, discuss changes with gynecology  Gastroesophageal reflux disease, unspecified whether esophagitis present - Plan: omeprazole (PRILOSEC) 20 MG capsule, famotidine (PEPCID) 20 MG tablet Continue Prilosec, Pepcid, trigger avoidance.  Overall stable.  Upper airway cough syndrome - Plan: famotidine (PEPCID) 20 MG tablet As above  Screening for lung cancer - Plan: Ambulatory Referral Lung Cancer Screening [facility]  Need for tetanus booster - Plan: Tdap vaccine greater than or equal to 7yo IM  Need for shingles vaccine - Plan: Varicella-zoster vaccine IM  Screening for HIV (human immunodeficiency virus) - Plan: HIV antibody (with reflex)  Need  for hepatitis C screening test - Plan: Hepatitis C Antibody   Meds ordered this encounter  Medications   DULoxetine (CYMBALTA) 30 MG capsule    Sig: Take 1 capsule (30 mg total) by mouth daily.    Dispense:  90 capsule    Refill:  1   ALPRAZolam (XANAX) 1 MG tablet    Sig: Take 0.5-1 tablets (0.5-1 mg total) by mouth daily as needed for anxiety.    Dispense:  30 tablet     Refill:  0   omeprazole (PRILOSEC) 20 MG capsule    Sig: Take 1 capsule (20 mg total) by mouth daily.    Dispense:  90 capsule    Refill:  0   famotidine (PEPCID) 20 MG tablet    Sig: Take 1 tablet (20 mg total) by mouth at bedtime.    Dispense:  90 tablet    Refill:  0   Patient Instructions  Continue Cymbalta 30 mg/day.  I did refill alprazolam but use that only if needed and lowest effective dose, start with half a pill if possible.  I will refer you to psychiatry to discuss depression and anxiety symptoms further and possible medication changes.  See information below on managing depression, anxiety symptoms for now.  You may also want to check with your insurance to see if there may be a lower cost counseling option based on your plan.  Let me know if I can help further.  Congratulations on decreased alcohol use, keep up the good work!  No change in heartburn medications at this time, keep follow-up with OB/GYN regarding hot flushes and treatments.  If any worsening pain, I would recommend follow-up with previous pain management practice.  Let me know if you have questions.  We will refer you for lung cancer screening, and I did check hepatitis C and HIV screening today for health maintenance.  Second shingles vaccine given today as well as Tdap.  Follow-up with me in 6 months but let me know if there are any questions or concerns in the meantime.  Take care!  Managing Depression, Adult Depression is a mental health condition that affects your thoughts, feelings, and actions. Being diagnosed with depression can bring you relief if you did not know why you have felt or behaved a certain way. It could also leave you feeling overwhelmed. Finding ways to manage your symptoms can help you feel more positive about your future. How to manage lifestyle changes Being depressed is difficult. Depression can increase the level of everyday stress. Stress can make depression symptoms worse. You may  believe your symptoms cannot be managed or will never improve. However, there are many things you can try to help manage your symptoms. There is hope. Managing stress  Stress is your body's reaction to life changes and events, both good and bad. Stress can add to your feelings of depression. Learning to manage your stress can help lessen your feelings of depression. Try some of the following approaches to reducing your stress (stress reduction techniques): Listen to music that you enjoy and that inspires you. Try using a meditation app or take a meditation class. Develop a practice that helps you connect with your spiritual self. Walk in nature, pray, or go to a place of worship. Practice deep breathing. To do this, inhale slowly through your nose. Pause at the top of your inhale for a few seconds and then exhale slowly, letting yourself relax. Repeat this three or four times. Practice yoga to  help relax and work your muscles. Choose a stress reduction technique that works for you. These techniques take time and practice to develop. Set aside 5-15 minutes a day to do them. Therapists can offer training in these techniques. Do these things to help manage stress: Keep a journal. Know your limits. Set healthy boundaries for yourself and others, such as saying "no" when you think something is too much. Pay attention to how you react to certain situations. You may not be able to control everything, but you can change your reaction. Add humor to your life by watching funny movies or shows. Make time for activities that you enjoy and that relax you. Spend less time using electronics, especially at night before bed. The light from screens can make your brain think it is time to get up rather than go to bed.  Medicines Medicines, such as antidepressants, are often a part of treatment for depression. Talk with your pharmacist or health care provider about all the medicines, supplements, and herbal products  that you take, their possible side effects, and what medicines and other products are safe to take together. Make sure to report any side effects you may have to your health care provider. Relationships Your health care provider may suggest family therapy, couples therapy, or individual therapy as part of your treatment. How to recognize changes Everyone responds differently to treatment for depression. As you recover from depression, you may start to: Have more interest in doing activities. Feel more hopeful. Have more energy. Eat a more regular amount of food. Have better mental focus. It is important to recognize if your depression is not getting better or is getting worse. The symptoms you had in the beginning may return, such as: Feeling tired. Eating too much or too little. Sleeping too much or too little. Feeling restless, agitated, or hopeless. Trouble focusing or making decisions. Having unexplained aches and pains. Feeling irritable, angry, or aggressive. If you or your family members notice these symptoms coming back, let your health care provider know right away. Follow these instructions at home: Activity Try to get some form of exercise each day, such as walking. Try yoga, mindfulness, or other stress reduction techniques. Participate in group activities if you are able. Lifestyle Get enough sleep. Cut down on or stop using caffeine, tobacco, alcohol, and any other harmful substances. Eat a healthy diet that includes plenty of vegetables, fruits, whole grains, low-fat dairy products, and lean protein. Limit foods that are high in solid fats, added sugar, or salt (sodium). General instructions Take over-the-counter and prescription medicines only as told by your health care provider. Keep all follow-up visits. It is important for your health care provider to check on your mood, behavior, and medicines. Your health care provider may need to make changes to your  treatment. Where to find support Talking to others  Friends and family members can be sources of support and guidance. Talk to trusted friends or family members about your condition. Explain your symptoms and let them know that you are working with a health care provider to treat your depression. Tell friends and family how they can help. Finances Find mental health providers that fit with your financial situation. Talk with your health care provider if you are worried about access to food, housing, or medicine. Call your insurance company to learn about your co-pays and prescription plan. Where to find more information You can find support in your area from: Anxiety and Depression Association of America (ADAA): adaa.org  Mental Health America: mentalhealthamerica.net The First American on Mental Illness: nami.org Contact a health care provider if: You stop taking your antidepressant medicines, and you have any of these symptoms: Nausea. Headache. Light-headedness. Chills and body aches. Not being able to sleep (insomnia). You or your friends and family think your depression is getting worse. Get help right away if: You have thoughts of hurting yourself or others. Get help right away if you feel like you may hurt yourself or others, or have thoughts about taking your own life. Go to your nearest emergency room or: Call 911. Call the National Suicide Prevention Lifeline at 575-758-6942 or 988. This is open 24 hours a day. Text the Crisis Text Line at 3022549889. This information is not intended to replace advice given to you by your health care provider. Make sure you discuss any questions you have with your health care provider. Document Revised: 02/28/2022 Document Reviewed: 02/28/2022 Elsevier Patient Education  2024 Elsevier Inc.   Managing Anxiety, Adult After being diagnosed with anxiety, you may be relieved to know why you have felt or behaved a certain way. You may also feel  overwhelmed about the treatment ahead and what it will mean for your life. With care and support, you can manage your anxiety. How to manage lifestyle changes Understanding the difference between stress and anxiety Although stress can play a role in anxiety, it is not the same as anxiety. Stress is your body's reaction to life changes and events, both good and bad. Stress is often caused by something external, such as a deadline, test, or competition. It normally goes away after the event has ended and will last just a few hours. But, stress can be ongoing and can lead to more than just stress. Anxiety is caused by something internal, such as imagining a terrible outcome or worrying that something will go wrong that will greatly upset you. Anxiety often does not go away even after the event is over, and it can become a long-term (chronic) worry. Lowering stress and anxiety Talk with your health care provider or a counselor to learn more about lowering anxiety and stress. They may suggest tension-reduction techniques, such as: Music. Spend time creating or listening to music that you enjoy and that inspires you. Mindfulness-based meditation. Practice being aware of your normal breaths while not trying to control your breathing. It can be done while sitting or walking. Centering prayer. Focus on a word, phrase, or sacred image that means something to you and brings you peace. Deep breathing. Expand your stomach and inhale slowly through your nose. Hold your breath for 3-5 seconds. Then breathe out slowly, letting your stomach muscles relax. Self-talk. Learn to notice and spot thought patterns that lead to anxiety reactions. Change those patterns to thoughts that feel peaceful. Muscle relaxation. Take time to tense muscles and then relax them. Choose a tension-reduction technique that fits your lifestyle and personality. These techniques take time and practice. Set aside 5-15 minutes a day to do them.  Specialized therapists can offer counseling and training in these techniques. The training to help with anxiety may be covered by some insurance plans. Other things you can do to manage stress and anxiety include: Keeping a stress diary. This can help you learn what triggers your reaction and then learn ways to manage your response. Thinking about how you react to certain situations. You may not be able to control everything, but you can control your response. Making time for activities that help you relax and  not feeling guilty about spending your time in this way. Doing visual imagery. This involves imagining or creating mental pictures to help you relax. Practicing yoga. Through yoga poses, you can lower tension and relax.  Medicines Medicines for anxiety include: Antidepressant medicines. These are usually prescribed for long-term daily control. Anti-anxiety medicines. These may be added in severe cases, especially when panic attacks occur. When used together, medicines, psychotherapy, and tension-reduction techniques may be the most effective treatment. Relationships Relationships can play a big part in helping you recover. Spend more time connecting with trusted friends and family members. Think about going to couples counseling if you have a partner, taking family education classes, or going to family therapy. Therapy can help you and others better understand your anxiety. How to recognize changes in your anxiety Everyone responds differently to treatment for anxiety. Recovery from anxiety happens when symptoms lessen and stop interfering with your daily life at home or work. This may mean that you will start to: Have better concentration and focus. Worry will interfere less in your daily thinking. Sleep better. Be less irritable. Have more energy. Have improved memory. Try to recognize when your condition is getting worse. Contact your provider if your symptoms interfere with home or  work and you feel like your condition is not improving. Follow these instructions at home: Activity Exercise. Adults should: Exercise for at least 150 minutes each week. The exercise should increase your heart rate and make you sweat (moderate-intensity exercise). Do strengthening exercises at least twice a week. Get the right amount and quality of sleep. Most adults need 7-9 hours of sleep each night. Lifestyle  Eat a healthy diet that includes plenty of vegetables, fruits, whole grains, low-fat dairy products, and lean protein. Do not eat a lot of foods that are high in fats, added sugars, or salt (sodium). Make choices that simplify your life. Do not use any products that contain nicotine or tobacco. These products include cigarettes, chewing tobacco, and vaping devices, such as e-cigarettes. If you need help quitting, ask your provider. Avoid caffeine, alcohol, and certain over-the-counter cold medicines. These may make you feel worse. Ask your pharmacist which medicines to avoid. General instructions Take over-the-counter and prescription medicines only as told by your provider. Keep all follow-up visits. This is to make sure you are managing your anxiety well or if you need more support. Where to find support You can get help and support from: Self-help groups. Online and [employer]. A trusted spiritual leader. Couples counseling. Family education classes. Family therapy. Where to find more information You may find that joining a support group helps you deal with your anxiety. The following sources can help you find counselors or support groups near you: Mental Health America: mentalhealthamerica.net Anxiety and Depression Association of [place] (ADAA): adaa.org The First American on Mental Illness (NAMI): nami.org Contact a health care provider if: You have a hard time staying focused or finishing tasks. You spend many hours a day feeling worried about everyday  life. You are very tired because you cannot stop worrying. You start to have headaches or often feel tense. You have chronic nausea or diarrhea. Get help right away if: Your heart feels like it is racing. You have shortness of breath. You have thoughts of hurting yourself or others. Get help right away if you feel like you may hurt yourself or others, or have thoughts about taking your own life. Go to your nearest emergency room or: Call 911. Call the [facility] Suicide Prevention Lifeline  at 787-466-6579 or 988. This is open 24 hours a day. Text the Crisis Text Line at 680-326-1818. This information is not intended to replace advice given to you by your health care provider. Make sure you discuss any questions you have with your health care provider. Document Revised: 08/01/2022 Document Reviewed: 02/13/2021 Elsevier Patient Education  2024 Elsevier Inc.     Signed,   Meredith Staggers, MD  Primary Care, [facility] 10/22/23 11:10 AM

## 2023-10-23 ENCOUNTER — Encounter: Payer: Self-pay | Admitting: Family Medicine

## 2023-10-23 LAB — HEPATITIS C ANTIBODY: Hepatitis C Ab: NONREACTIVE

## 2023-10-23 LAB — HIV ANTIBODY (ROUTINE TESTING W REFLEX): HIV 1&2 Ab, 4th Generation: NONREACTIVE

## 2023-10-25 ENCOUNTER — Ambulatory Visit: Payer: 59 | Admitting: Rheumatology

## 2023-10-26 ENCOUNTER — Other Ambulatory Visit: Payer: Self-pay

## 2023-10-26 DIAGNOSIS — Z87891 Personal history of nicotine dependence: Secondary | ICD-10-CM

## 2023-10-26 DIAGNOSIS — F17210 Nicotine dependence, cigarettes, uncomplicated: Secondary | ICD-10-CM

## 2023-10-26 DIAGNOSIS — Z122 Encounter for screening for malignant neoplasm of respiratory organs: Secondary | ICD-10-CM

## 2023-11-12 ENCOUNTER — Ambulatory Visit (INDEPENDENT_AMBULATORY_CARE_PROVIDER_SITE_OTHER): Payer: 59 | Admitting: Acute Care

## 2023-11-12 DIAGNOSIS — F17210 Nicotine dependence, cigarettes, uncomplicated: Secondary | ICD-10-CM

## 2023-11-12 NOTE — Patient Instructions (Signed)

## 2023-11-12 NOTE — Progress Notes (Signed)
 Provider Attestation I agree with the documentation of the Shared Decision Making visit,  smoking cessation counseling if appropriate, and verification or eligibility for lung cancer screening as documented by the RN Nurse Navigator.   Raejean Bullock, MSN, AGACNP-BC [facility] See Amion for personal pager PCCM on call pager [phone]    Virtual Visit via Telephone Note  I connected with Carly Wilson on 11/12/23 at  2:00 PM EST by telephone and verified that I am speaking with the correct person using two identifiers.  Location: Patient: in home Provider: [address] [place], [place], [address]    Shared Decision Making Visit Lung Cancer Screening Program [phone])   Eligibility: Age 26 y.o. Pack Years Smoking History Calculation 54 (# packs/per year x # years smoked) Recent History of coughing up blood  no Unexplained weight loss? no ( >Than 15 pounds within the last 6 months ) Prior History Lung / other cancer no (Diagnosis within the last 5 years already requiring surveillance chest CT Scans). Smoking Status Current Smoker Former Smokers: Years since quit:  NA  Quit Date: NA  Visit Components: Discussion included one or more decision making aids. yes Discussion included risk/benefits of screening. yes Discussion included potential follow up diagnostic testing for abnormal scans. yes Discussion included meaning and risk of over diagnosis. yes Discussion included meaning and risk of False Positives. yes Discussion included meaning of total radiation exposure. yes  Counseling Included: Importance of adherence to annual lung cancer LDCT screening. yes Impact of comorbidities on ability to participate in the program. yes Ability and willingness to under diagnostic treatment. yes  Smoking Cessation Counseling: Current Smokers:  Discussed importance of smoking cessation. yes Information about tobacco cessation classes and  interventions provided to patient. yes Patient provided with "ticket" for LDCT Scan. yes Symptomatic Patient. no  Counseling NA Diagnosis Code: Tobacco Use Z72.0 Asymptomatic Patient yes  Counseling (Intermediate counseling: > three minutes counseling) [id number] Former Smokers:  Discussed the importance of maintaining cigarette abstinence. yes Diagnosis Code: Personal History of Nicotine Dependence. Q59.563 Information about tobacco cessation classes and interventions provided to patient. Yes Patient provided with "ticket" for LDCT Scan. yes Written Order for Lung Cancer Screening with LDCT placed in Epic. Yes (CT Chest Lung Cancer Screening Low Dose W/O CM) [id number] Z12.2-Screening of respiratory organs Z87.891-Personal history of nicotine dependence   Valentin Gaskins, RN 11/12/23

## 2023-11-14 ENCOUNTER — Ambulatory Visit
Admission: RE | Admit: 2023-11-14 | Discharge: 2023-11-14 | Disposition: A | Discharge status: Home / Self Care | Payer: 59 | Source: Ambulatory Visit | Attending: Acute Care | Admitting: Acute Care

## 2023-11-14 DIAGNOSIS — Z87891 Personal history of nicotine dependence: Secondary | ICD-10-CM

## 2023-11-14 DIAGNOSIS — F17210 Nicotine dependence, cigarettes, uncomplicated: Secondary | ICD-10-CM | POA: Diagnosis not present

## 2023-11-14 DIAGNOSIS — Z122 Encounter for screening for malignant neoplasm of respiratory organs: Secondary | ICD-10-CM

## 2023-11-23 ENCOUNTER — Other Ambulatory Visit: Payer: Self-pay

## 2023-11-23 DIAGNOSIS — Z122 Encounter for screening for malignant neoplasm of respiratory organs: Secondary | ICD-10-CM

## 2023-11-23 DIAGNOSIS — Z87891 Personal history of nicotine dependence: Secondary | ICD-10-CM

## 2023-11-23 DIAGNOSIS — F17210 Nicotine dependence, cigarettes, uncomplicated: Secondary | ICD-10-CM

## 2024-02-20 ENCOUNTER — Other Ambulatory Visit: Payer: Self-pay | Admitting: Family Medicine

## 2024-02-20 ENCOUNTER — Other Ambulatory Visit: Payer: Self-pay

## 2024-02-20 NOTE — Telephone Encounter (Signed)
 Requested Prescriptions   Pending Prescriptions Disp Refills   ALPRAZolam (XANAX) 1 MG tablet [Pharmacy Med Name: ALPRAZolam 1 MG Oral Tablet] 30 tablet 0    Sig: TAKE 1/2 TO 1 (ONE-HALF TO ONE) TABLET BY MOUTH ONCE DAILY AS NEEDED FOR ANXIETY     Date of patient request: 02/20/2024 Last office visit: 10/22/2023 Upcoming visit: 04/21/2024 Date of last refill: 10/22/2023 Last refill amount: 30

## 2024-02-20 NOTE — Telephone Encounter (Signed)
 Office visit October 22, 2023.  Medications discussed at that time.  Infrequent need for alprazolam, and half dosing at times.  Controlled substance database reviewed.  #30 last filled on 10/30/2023.  Refill ordered.

## 2024-04-12 ENCOUNTER — Other Ambulatory Visit: Payer: Self-pay | Admitting: Family Medicine

## 2024-04-12 DIAGNOSIS — K219 Gastro-esophageal reflux disease without esophagitis: Secondary | ICD-10-CM

## 2024-04-12 DIAGNOSIS — R058 Other specified cough: Secondary | ICD-10-CM

## 2024-04-21 ENCOUNTER — Encounter: Payer: Self-pay | Admitting: Family Medicine

## 2024-04-21 ENCOUNTER — Ambulatory Visit (INDEPENDENT_AMBULATORY_CARE_PROVIDER_SITE_OTHER): Payer: 59 | Admitting: Family Medicine

## 2024-04-21 VITALS — BP 116/70 | HR 82 | Wt 166.2 lb

## 2024-04-21 DIAGNOSIS — F418 Other specified anxiety disorders: Secondary | ICD-10-CM

## 2024-04-21 DIAGNOSIS — G894 Chronic pain syndrome: Secondary | ICD-10-CM

## 2024-04-21 DIAGNOSIS — Z0001 Encounter for general adult medical examination with abnormal findings: Secondary | ICD-10-CM | POA: Diagnosis not present

## 2024-04-21 DIAGNOSIS — R059 Cough, unspecified: Secondary | ICD-10-CM | POA: Diagnosis not present

## 2024-04-21 DIAGNOSIS — Z13220 Encounter for screening for lipoid disorders: Secondary | ICD-10-CM

## 2024-04-21 DIAGNOSIS — R058 Other specified cough: Secondary | ICD-10-CM

## 2024-04-21 DIAGNOSIS — M2550 Pain in unspecified joint: Secondary | ICD-10-CM | POA: Diagnosis not present

## 2024-04-21 DIAGNOSIS — Z0000 Encounter for general adult medical examination without abnormal findings: Secondary | ICD-10-CM

## 2024-04-21 DIAGNOSIS — R109 Unspecified abdominal pain: Secondary | ICD-10-CM | POA: Diagnosis not present

## 2024-04-21 DIAGNOSIS — R112 Nausea with vomiting, unspecified: Secondary | ICD-10-CM

## 2024-04-21 DIAGNOSIS — Z23 Encounter for immunization: Secondary | ICD-10-CM | POA: Diagnosis not present

## 2024-04-21 DIAGNOSIS — R918 Other nonspecific abnormal finding of lung field: Secondary | ICD-10-CM

## 2024-04-21 DIAGNOSIS — R197 Diarrhea, unspecified: Secondary | ICD-10-CM

## 2024-04-21 DIAGNOSIS — K219 Gastro-esophageal reflux disease without esophagitis: Secondary | ICD-10-CM

## 2024-04-21 LAB — COMPREHENSIVE METABOLIC PANEL WITH GFR
ALT: 15 U/L (ref 0–35)
AST: 15 U/L (ref 0–37)
Albumin: 4.7 g/dL (ref 3.5–5.2)
Alkaline Phosphatase: 38 U/L — ABNORMAL LOW (ref 39–117)
BUN: 15 mg/dL (ref 6–23)
CO2: 29 meq/L (ref 19–32)
Calcium: 9.5 mg/dL (ref 8.4–10.5)
Chloride: 99 meq/L (ref 96–112)
Creatinine, Ser: 0.7 mg/dL (ref 0.40–1.20)
GFR: 99.39 mL/min (ref >60.00)
Glucose, Bld: 92 mg/dL (ref 70–99)
Potassium: 4 meq/L (ref 3.5–5.1)
Sodium: 137 meq/L (ref 135–145)
Total Bilirubin: 0.5 mg/dL (ref 0.2–1.2)
Total Protein: 7.3 g/dL (ref 6.0–8.3)

## 2024-04-21 LAB — LIPASE: Lipase: 32 U/L (ref 11.0–59.0)

## 2024-04-21 LAB — LIPID PANEL
Cholesterol: 217 mg/dL — ABNORMAL HIGH (ref 0–200)
HDL: 54.4 mg/dL (ref >39.00)
LDL Cholesterol: 139 mg/dL — ABNORMAL HIGH (ref 0–99)
NonHDL: 162.39
Total CHOL/HDL Ratio: 4
Triglycerides: 119 mg/dL (ref 0.0–149.0)
VLDL: 23.8 mg/dL (ref 0.0–40.0)

## 2024-04-21 LAB — CBC
HCT: 44.2 % (ref 36.0–46.0)
Hemoglobin: 14.9 g/dL (ref 12.0–15.0)
MCHC: 33.7 g/dL (ref 30.0–36.0)
MCV: 92.3 fl (ref 78.0–100.0)
Platelets: 126 10*3/uL — ABNORMAL LOW (ref 150.0–400.0)
RBC: 4.79 Mil/uL (ref 3.87–5.11)
RDW: 13.4 % (ref 11.5–15.5)
WBC: 7.8 10*3/uL (ref 4.0–10.5)

## 2024-04-21 MED ORDER — OMEPRAZOLE 20 MG PO CPDR: 20.0000 mg | DELAYED_RELEASE_CAPSULE | Freq: Every day | ORAL | 1 refills | Status: AC

## 2024-04-21 MED ORDER — FAMOTIDINE 20 MG PO TABS: 20.0000 mg | ORAL_TABLET | Freq: Every day | ORAL | 0 refills | Status: DC

## 2024-04-21 MED ORDER — DULOXETINE HCL 30 MG PO CPEP: 30.0000 mg | ORAL_CAPSULE | Freq: Every day | ORAL | 1 refills | Status: DC

## 2024-04-21 NOTE — Patient Instructions (Addendum)
 I will check some labs with abdominal pain but if that occurs again or you do not continue to improve in the next few days please be seen.  I do also recommend discussing the symptoms with your gastroenterologist.  I did place a new referral to rheumatology but they may want to to meet with pain management or orthopedics depending on their workup.  Let me know.  Also referred you to pulmonary with the morning cough, and possible COPD seen on the previous CT scan.  See information below on quitting smoking and let us  know if we can help.  If depression symptoms are not stable I would recommend meeting with psychiatry, let me know.  I did refill Cymbalta  same dose for now.  Continue omeprazole  and Pepcid  if those are effective.  [facility] may have some less expensive over-the-counter options if insurance does not cover these meds.  Return to the clinic or go to the nearest emergency room if any of your symptoms worsen or new symptoms occur.   Abdominal Pain, Adult  Pain in the abdomen (abdominal pain) can be caused by many things. In most cases, it gets better with no treatment or by being treated at home. But in some cases, it can be serious. Your health care provider will ask questions about your medical history and do a physical exam to try to figure out what is causing your pain. Follow these instructions at home: Medicines Take over-the-counter and prescription medicines only as told by your provider. Do not take medicines that help you poop (laxatives) unless told by your provider. General instructions Watch your condition for any changes. Drink enough fluid to keep your pee (urine) pale yellow. Contact a health care provider if: Your pain changes, gets worse, or lasts longer than expected. You have severe cramping or bloating in your abdomen, or you vomit. Your pain gets worse with meals, after eating, or with certain foods. You are constipated or have diarrhea  for more than 2-3 days. You are not hungry, or you lose weight without trying. You have signs of dehydration. These may include: Dark pee, very little pee, or no pee. Cracked lips or dry mouth. Sleepiness or weakness. You have pain when you pee (urinate) or poop. Your abdominal pain wakes you up at night. You have blood in your pee. You have a fever. Get help right away if: You cannot stop vomiting. Your pain is only in one part of the abdomen. Pain on the right side could be caused by appendicitis. You have bloody or black poop (stool), or poop that looks like tar. You have trouble breathing. You have chest pain. These symptoms may be an emergency. Get help right away. Call 911. Do not wait to see if the symptoms will go away. Do not drive yourself to the hospital. This information is not intended to replace advice given to you by your health care provider. Make sure you discuss any questions you have with your health care provider. Document Revised: 08/09/2022 Document Reviewed: 08/09/2022 Elsevier Patient Education  2024 [employer] Steps to Quit Smoking Smoking tobacco is the leading cause of preventable death. It can affect almost every organ in the body. Smoking puts you and those around you at risk for developing many serious chronic diseases. Quitting smoking can be very challenging. Do not get discouraged if you are not successful the first time. Some people need to make many attempts to quit before they achieve long-term success. Do your best  to stick to your quit plan, and talk with your health care provider if you have any questions or concerns. How do I get ready to quit? When you decide to quit smoking, create a plan to help you succeed. Before you quit: Pick a date to quit. Set a date within the next 2 weeks to give you time to prepare. Write down the reasons why you are quitting. Keep this list in places where you will see it often. Tell your family, friends, and  co-workers that you are quitting. Support from people you are close to can make quitting easier. Talk with your health care provider about your options for quitting smoking. Find out what treatment options are covered by your health insurance. Identify people, places, things, and activities that make you want to smoke (triggers). Avoid them. What first steps can I take to quit smoking? Throw away all cigarettes at home, at work, and in your car. Throw away smoking accessories, such as Set designer. Clean your car. Make sure to empty the ashtray. Clean your home, including curtains and carpets. What strategies can I use to quit smoking? Talk with your health care provider about combining strategies, such as taking medicines while you are also receiving in-person counseling. Using these two strategies together makes you more likely to succeed in quitting than if you used either strategy on its own. If you are pregnant or breastfeeding, talk with your health care provider about finding counseling or other support strategies to quit smoking. Do not take medicine to help you quit smoking unless your health care provider tells you to. Quit right away Quit smoking completely, instead of gradually reducing how much you smoke over a period of time. Stopping smoking right away may be more successful than gradually quitting. Attend in-person counseling to help you build problem-solving skills. You are more likely to succeed in quitting if you attend counseling sessions regularly. Even short sessions of 10 minutes can be effective. Take medicine You may take medicines to help you quit smoking. Some medicines require a prescription. You can also purchase over-the-counter medicines. Medicines may have nicotine in them to replace the nicotine in cigarettes. Medicines may: Help to stop cravings. Help to relieve withdrawal symptoms. Your health care provider may recommend: Nicotine patches, gum, or  lozenges. Nicotine inhalers or sprays. Non-nicotine medicine that you take by mouth. Find resources Find resources and support systems that can help you quit smoking and remain smoke-free after you quit. These resources are most helpful when you use them often. They include: Online chats with a Veterinary surgeon. Telephone quitlines. Printed Materials engineer. Support groups or group counseling. Text messaging programs. Mobile phone apps or applications. Use apps that can help you stick to your quit plan by providing reminders, tips, and encouragement. Examples of free services include Quit Guide from the CDC and smokefree.gov  What can I do to make it easier to quit?  Reach out to your family and friends for support and encouragement. Call telephone quitlines, such as 1-800-QUIT-NOW, reach out to support groups, or work with a counselor for support. Ask people who smoke to avoid smoking around you. Avoid places that trigger you to smoke, such as bars, parties, or smoke-break areas at work. Spend time with people who do not smoke. Lessen the stress in your life. Stress can be a smoking trigger for some people. To lessen stress, try: Exercising regularly. Doing deep-breathing exercises. Doing yoga. Meditating. What benefits will I see if I quit smoking? Over  time, you should start to see positive results, such as: Improved sense of smell and taste. Decreased coughing and sore throat. Slower heart rate. Lower blood pressure. Clearer and healthier skin. The ability to breathe more easily. Fewer sick days. Summary Quitting smoking can be very challenging. Do not get discouraged if you are not successful the first time. Some people need to make many attempts to quit before they achieve long-term success. When you decide to quit smoking, create a plan to help you succeed. Quit smoking right away, not slowly over a period of time. Find resources and support systems that can help you quit  smoking and remain smoke-free after you quit. This information is not intended to replace advice given to you by your health care provider. Make sure you discuss any questions you have with your health care provider. Document Revised: 10/14/2021 Document Reviewed: 10/14/2021 Elsevier Patient Education  2024 [employer].

## 2024-04-21 NOTE — Progress Notes (Unsigned)
 Subjective:  Patient ID: Carly Wilson, female    DOB: [birth year]  Age: 53 y.o. MRN: [id number]  CC:  Chief Complaint  Patient presents with   Annual Exam    Physical having diaherra and vomitting , sometimes , she thought she may had food poison     HPI Carly Wilson presents for Annual Exam With other concern above.   PCP, me Pulmonary/lung cancer screening, [facility].  History of tobacco use.  CT chest 11/14/2023, aortic atherosclerosis, diffuse bronchial wall thickening with mild centrilobular and paraseptal emphysema suggestive of underlying COPD, lung RADS 2 with plan for repeat CT chest in 12 months.  - some cough in morning, some wheeze at times in am. Would like to meet with with pulmonary. Still smoking.  Gynecology Amiel Balder, Pap in 2022, mammogram last July. Gastroenterology, Dr. Tova Fresh, hx of lymphocytic colitis. On budesonide 3 capsules per day.   Vomiting, diarrhea: Initial episode a year and a half ago. Resolved, then 2 episodes in past month. Mother's day and then last week - lasts overnight, multiple episodes vomiting overnight along with diarrhea. Unknown if blood, had red wine. 2 glasses wine that day prior to vomiting. exhausted next day. Abd pain during the episode, less now, but still some slight soreness. No fever. 1-2 alcohol drinks per day. Drinking less. Has not seen GI for these sx's.   Depression with anxiety, chronic pain syndrome See previous notes, last discussed in December.  Intolerant to higher doses of Cymbalta , treated with 30 mg, no benefit with Wellbutrin addition in the past, side effects within the fasting.  Gabapentin  has been helpful for hot flashes as well as chronic pain and has seen pain  management in past.  Infrequent need for alprazolam  every week or 2 -few times per month when discussed in December.  Given intolerances to some medications and some continued symptoms referred to psychiatry previously.  We also discussed counseling but  concern of that being cost prohibitive, recommend she discuss with her insurance to see if there may be preferred providers that would be decreased cost. Lost insurance, unable to see psychiatry. Feels better now - declines referral to psychiatry for now.   She was also referred to rheumatology previously but was not able to go due to losing insurance.  Would like to meet with rheumatology this point due to persistent pain in her joints, hands. Referred in June 2024 by her gynecologist For polyarthralgia  -Per review of note in June 2024 by Dr. Annabell Key, patient had joint pains especially in hands and ankles, normal sed rate ANA and negative rheumatoid factor.  Trouble holding on objects due to pain, hands hurt especially in the thumb joint. Still same issues, and pain in both hips - going on for some time.  worse on right and some knee pain on right. Pain for some time - for years, no recent injury.  On gabapentin  -300mg  in am, 900mg  at night, Dr. Annabell Key Rx.      04/21/2024    1:07 PM 10/22/2023   10:23 AM 05/01/2023    4:27 PM 04/17/2023    4:13 PM 08/07/2022   11:31 AM  Depression screen PHQ 2/9  Decreased Interest 1 1 0 0 1  Down, Depressed, Hopeless 1 1 0 0 1  PHQ - 2 Score 2 2 0 0 2  Altered sleeping 0 2   0  Tired, decreased energy 1 1   1   Change in appetite 0 0  0  Feeling bad or failure about yourself  0 1   1  Trouble concentrating 0 2   2  Moving slowly or fidgety/restless 0 1   0  Suicidal thoughts 0 0   0  PHQ-9 Score 3 9   6       04/21/2024    1:07 PM 10/22/2023   10:23 AM 08/07/2022   11:32 AM  GAD 7 : Generalized Anxiety Score  Nervous, Anxious, on Edge 3 2 2   Control/stop worrying 1 2 1   Worry too much - different things 1 2 2   Trouble relaxing 0 1 1  Restless 0 1 0  Easily annoyed or irritable 1 3 1   Afraid - awful might happen 1 1 1   Total GAD 7 Score 7 12 8   Anxiety Difficulty Somewhat difficult     GERD Treated with omeprazole  qam,  Pepcid  at night.  Followed  by gastroenterology as above. Taking daily meds. Stable on meds.   No results found for: CHOL, HDL, LDLCALC, LDLDIRECT, TRIG, CHOLHDL      04/21/2024    1:07 PM 10/22/2023   10:23 AM 05/01/2023    4:27 PM 04/17/2023    4:13 PM 08/07/2022   11:31 AM  Depression screen PHQ 2/9  Decreased Interest 1 1 0 0 1  Down, Depressed, Hopeless 1 1 0 0 1  PHQ - 2 Score 2 2 0 0 2  Altered sleeping 0 2   0  Tired, decreased energy 1 1   1   Change in appetite 0 0   0  Feeling bad or failure about yourself  0 1   1  Trouble concentrating 0 2   2  Moving slowly or fidgety/restless 0 1   0  Suicidal thoughts 0 0   0  PHQ-9 Score 3 9   6     Health Maintenance  Topic Date Due   Pneumococcal Vaccine 53-78 Years old (1 of 2 - PCV) Never done   COVID-19 Vaccine (3 - 2024-25 season) 07/08/2023   INFLUENZA VACCINE  06/06/2024   Lung Cancer Screening  11/13/2024   MAMMOGRAM  05/14/2025   Cervical Cancer Screening (HPV/Pap Cotest)  09/06/2026   Colonoscopy  06/05/2033   DTaP/Tdap/Td (3 - Td or Tdap) 10/21/2033   Hepatitis C Screening  Completed   HIV Screening  Completed   Zoster Vaccines- Shingrix   Completed   HPV VACCINES  Aged Out   Meningococcal B Vaccine  Aged Out  Colonoscopy 06/06/2023 CT chest in January as above for lung cancer screening.   Mammogram 05/15/2023 Pap testing November 2022.  Immunization History  Administered Date(s) Administered   PFIZER(Purple Top)SARS-COV-2 Vaccination 02/13/2020, 03/05/2020   Tdap 11/25/2008, 10/22/2023   Zoster Recombinant(Shingrix ) 08/07/2022, 10/22/2023  Pneumonia vaccine today.  Flu covid vaccine recommended.   No results found. Optho - planned exam with sister in law soon.   Dental: every 6 months, cleaning.   Alcohol: 1-2 drinks per day.  `                           Tobacco: 1.5 ppd. Thoughts on quitting - not ready to quit.   Exercise: walking dog.    History Patient Active Problem List   Diagnosis Date Noted   Rectal  bleeding 09/08/2021   Hot flashes 09/08/2021   Pain in joint, multiple sites 09/08/2021   Upper airway cough syndrome vs cough variant asthma 05/25/2014   Smoker 05/25/2014  Past Medical History:  Diagnosis Date   Allergy    Anemia    Anxiety    Arthritis    Chronic headaches    Depression    GERD (gastroesophageal reflux disease)    Past Surgical History:  Procedure Laterality Date   APPENDECTOMY  11/06/1988   CARPAL TUNNEL RELEASE Left 1996   wtih ganglion cyst removal   LUMBAR FUSION  2011   MICRODISCECTOMY LUMBAR  2001   Allergies  Allergen Reactions   Sulfa Antibiotics     Nausea    Vicodin [Hydrocodone-Acetaminophen]     nausea   Prior to Admission medications   Medication Sig Start Date End Date Taking? Authorizing Provider  acetaminophen (TYLENOL) 650 MG CR tablet Take 2 tablets by mouth daily.    [provider]  ALPRAZolam  (XANAX ) 1 MG tablet TAKE 1/2 TO 1 (ONE-HALF TO ONE) TABLET BY MOUTH ONCE DAILY AS NEEDED FOR ANXIETY 02/20/24   Benjiman Bras, MD  budesonide (ENTOCORT EC) 3 MG 24 hr capsule Take 9 mg by mouth every morning.    [provider]  DULoxetine  (CYMBALTA ) 30 MG capsule Take 1 capsule (30 mg total) by mouth daily. 10/22/23   Benjiman Bras, MD  famotidine  (PEPCID ) 20 MG tablet TAKE 1 TABLET BY MOUTH AT BEDTIME 04/14/24   Benjiman Bras, MD  gabapentin  (NEURONTIN ) 300 MG capsule Take 1 capsule in AM and 3 capsules nightly 04/17/23   Lillian Rein, MD  omeprazole  (PRILOSEC) 20 MG capsule Take 1 capsule by mouth once daily 04/14/24   Benjiman Bras, MD  phenazopyridine  (PYRIDIUM ) 200 MG tablet Take 1 tablet (200 mg total) by mouth 3 (three) times daily as needed for pain. 05/16/23   Lillian Rein, MD  promethazine  (PHENERGAN ) 25 MG tablet Take 1 tablet by mouth every 6 (six) hours as needed. 06/20/19   [provider]   Social History   Socioeconomic History   Marital status: Divorced    Spouse name: Not on file    Number of children: Not on file   Years of education: Not on file   Highest education level: Not on file  Occupational History   Not on file  Tobacco Use   Smoking status: Every Day    Current packs/day: 1.00    Average packs/day: 1 pack/day for 26.0 years (26.0 ttl pk-yrs)    Types: Cigarettes   Smokeless tobacco: Never  Substance and Sexual Activity   Alcohol use: Yes    Comment: 12-42 beers a week   Drug use: Yes    Types: Marijuana   Sexual activity: Yes    Birth control/protection: None  Other Topics Concern   Not on file  Social History Narrative   Not on file   Social Drivers of Health   Financial Resource Strain: Not on file  Food Insecurity: Not on file  Transportation Needs: Not on file  Physical Activity: Not on file  Stress: Not on file  Social Connections: Not on file  Intimate Partner Violence: Not on file    Review of Systems 13 point review of systems per patient health survey noted.  Negative other than as indicated above or in HPI.    Objective:   Vitals:   04/21/24 1308  BP: 116/70  Pulse: 82  SpO2: 94%  Weight: 166 lb 3.2 oz (75.4 kg)   {Vitals History (Optional):23777}  Physical Exam Vitals reviewed.  Constitutional:      Appearance: She is well-developed.  HENT:     Head: Normocephalic and atraumatic.     Right Ear: External ear normal.     Left Ear: External ear normal.   Eyes:     Conjunctiva/sclera: Conjunctivae normal.     Pupils: Pupils are equal, round, and reactive to light.   Neck:     Thyroid : No thyromegaly.   Cardiovascular:     Rate and Rhythm: Normal rate and regular rhythm.     Heart sounds: Normal heart sounds. No murmur heard. Pulmonary:     Effort: Pulmonary effort is normal. No respiratory distress.     Breath sounds: Normal breath sounds. No wheezing.  Abdominal:     General: Bowel sounds are normal. There is no distension.     Palpations: Abdomen is soft.     Tenderness: There is abdominal tenderness  (Minimal suprapubic.  No rebound or guarding.). There is no guarding.   Musculoskeletal:        General: No tenderness. Normal range of motion.     Cervical back: Normal range of motion and neck supple.     Comments: Few slightly prominent DIP joints of hands, otherwise no appreciable joint swelling or erythema.  Internal, external rotation of bilateral hips without apparent discomfort as well as flexion, extension of knees.  Lymphadenopathy:     Cervical: No cervical adenopathy.   Skin:    General: Skin is warm and dry.     Findings: No rash.   Neurological:     Mental Status: She is alert and oriented to person, place, and time.   Psychiatric:        Behavior: Behavior normal.        Thought Content: Thought content normal.        Assessment & Plan:  Carly Wilson is a 53 y.o. female . Annual physical exam  Abdominal pain, unspecified abdominal location - Plan: CBC, Comprehensive metabolic panel with GFR, Lipase, POCT urinalysis dipstick  Nausea and vomiting, unspecified vomiting type - Plan: CBC, Comprehensive metabolic panel with GFR, Lipase, POCT urinalysis dipstick  Diarrhea, unspecified type  Polyarthralgia - Plan: Ambulatory referral to Rheumatology  Gastroesophageal reflux disease, unspecified whether esophagitis present - Plan: famotidine  (PEPCID ) 20 MG tablet, omeprazole  (PRILOSEC) 20 MG capsule  Upper airway cough syndrome - Plan: famotidine  (PEPCID ) 20 MG tablet  Chronic pain syndrome - Plan: DULoxetine  (CYMBALTA ) 30 MG capsule  Depression with anxiety - Plan: DULoxetine  (CYMBALTA ) 30 MG capsule  Cough, unspecified type - Plan: Ambulatory referral to Pulmonology  Abnormal CT scan of lung - Plan: Ambulatory referral to Pulmonology  Screening for hyperlipidemia - Plan: Lipid panel  Need for pneumococcal vaccination - Plan: Pneumococcal conjugate vaccine 20-valent (Prevnar 20)   Meds ordered this encounter  Medications   famotidine  (PEPCID ) 20 MG  tablet    Sig: Take 1 tablet (20 mg total) by mouth at bedtime.    Dispense:  90 tablet    Refill:  0   DULoxetine  (CYMBALTA ) 30 MG capsule    Sig: Take 1 capsule (30 mg total) by mouth daily.    Dispense:  90 capsule    Refill:  1   omeprazole  (PRILOSEC) 20 MG capsule    Sig: Take 1 capsule (20 mg total) by mouth daily.    Dispense:  90 capsule    Refill:  1   Patient Instructions  I will check some labs with abdominal pain but if that occurs again or you do not continue to improve in the next  few days please be seen.  I do also recommend discussing the symptoms with your gastroenterologist.  I did place a new referral to rheumatology but they may want to to meet with pain management or orthopedics depending on their workup.  Let me know.  Also referred you to pulmonary with the morning cough, and possible COPD seen on the previous CT scan.  See information below on quitting smoking and let us  know if we can help.  If depression symptoms are not stable I would recommend meeting with psychiatry, let me know.  I did refill Cymbalta  same dose for now.  Continue omeprazole  and Pepcid  if those are effective.  [place] outpatient pharmacy may have some less expensive over-the-counter options if insurance does not cover these meds.  Return to the clinic or go to the nearest emergency room if any of your symptoms worsen or new symptoms occur.   Abdominal Pain, Adult  Pain in the abdomen (abdominal pain) can be caused by many things. In most cases, it gets better with no treatment or by being treated at home. But in some cases, it can be serious. Your health care provider will ask questions about your medical history and do a physical exam to try to figure out what is causing your pain. Follow these instructions at home: Medicines Take over-the-counter and prescription medicines only as told by your provider. Do not take medicines that help you poop (laxatives) unless told by your  provider. General instructions Watch your condition for any changes. Drink enough fluid to keep your pee (urine) pale yellow. Contact a health care provider if: Your pain changes, gets worse, or lasts longer than expected. You have severe cramping or bloating in your abdomen, or you vomit. Your pain gets worse with meals, after eating, or with certain foods. You are constipated or have diarrhea for more than 2-3 days. You are not hungry, or you lose weight without trying. You have signs of dehydration. These may include: Dark pee, very little pee, or no pee. Cracked lips or dry mouth. Sleepiness or weakness. You have pain when you pee (urinate) or poop. Your abdominal pain wakes you up at night. You have blood in your pee. You have a fever. Get help right away if: You cannot stop vomiting. Your pain is only in one part of the abdomen. Pain on the right side could be caused by appendicitis. You have bloody or black poop (stool), or poop that looks like tar. You have trouble breathing. You have chest pain. These symptoms may be an emergency. Get help right away. Call 911. Do not wait to see if the symptoms will go away. Do not drive yourself to the hospital. This information is not intended to replace advice given to you by your health care provider. Make sure you discuss any questions you have with your health care provider. Document Revised: 08/09/2022 Document Reviewed: 08/09/2022 Elsevier Patient Education  2024 Elsevier Inc. Steps to Quit Smoking Smoking tobacco is the leading cause of preventable death. It can affect almost every organ in the body. Smoking puts you and those around you at risk for developing many serious chronic diseases. Quitting smoking can be very challenging. Do not get discouraged if you are not successful the first time. Some people need to make many attempts to quit before they achieve long-term success. Do your best to stick to your quit plan, and talk  with your health care provider if you have any questions or concerns. How do  I get ready to quit? When you decide to quit smoking, create a plan to help you succeed. Before you quit: Pick a date to quit. Set a date within the next 2 weeks to give you time to prepare. Write down the reasons why you are quitting. Keep this list in places where you will see it often. Tell your family, friends, and co-workers that you are quitting. Support from people you are close to can make quitting easier. Talk with your health care provider about your options for quitting smoking. Find out what treatment options are covered by your health insurance. Identify people, places, things, and activities that make you want to smoke (triggers). Avoid them. What first steps can I take to quit smoking? Throw away all cigarettes at home, at work, and in your car. Throw away smoking accessories, such as Set designer. Clean your car. Make sure to empty the ashtray. Clean your home, including curtains and carpets. What strategies can I use to quit smoking? Talk with your health care provider about combining strategies, such as taking medicines while you are also receiving in-person counseling. Using these two strategies together makes you more likely to succeed in quitting than if you used either strategy on its own. If you are pregnant or breastfeeding, talk with your health care provider about finding counseling or other support strategies to quit smoking. Do not take medicine to help you quit smoking unless your health care provider tells you to. Quit right away Quit smoking completely, instead of gradually reducing how much you smoke over a period of time. Stopping smoking right away may be more successful than gradually quitting. Attend in-person counseling to help you build problem-solving skills. You are more likely to succeed in quitting if you attend counseling sessions regularly. Even short sessions of 10  minutes can be effective. Take medicine You may take medicines to help you quit smoking. Some medicines require a prescription. You can also purchase over-the-counter medicines. Medicines may have nicotine in them to replace the nicotine in cigarettes. Medicines may: Help to stop cravings. Help to relieve withdrawal symptoms. Your health care provider may recommend: Nicotine patches, gum, or lozenges. Nicotine inhalers or sprays. Non-nicotine medicine that you take by mouth. Find resources Find resources and support systems that can help you quit smoking and remain smoke-free after you quit. These resources are most helpful when you use them often. They include: Online chats with a Veterinary surgeon. Telephone quitlines. Printed Materials engineer. Support groups or group counseling. Text messaging programs. Mobile phone apps or applications. Use apps that can help you stick to your quit plan by providing reminders, tips, and encouragement. Examples of free services include Quit Guide from the CDC and smokefree.gov  What can I do to make it easier to quit?  Reach out to your family and friends for support and encouragement. Call telephone quitlines, such as 1-800-QUIT-NOW, reach out to support groups, or work with a counselor for support. Ask people who smoke to avoid smoking around you. Avoid places that trigger you to smoke, such as bars, parties, or smoke-break areas at work. Spend time with people who do not smoke. Lessen the stress in your life. Stress can be a smoking trigger for some people. To lessen stress, try: Exercising regularly. Doing deep-breathing exercises. Doing yoga. Meditating. What benefits will I see if I quit smoking? Over time, you should start to see positive results, such as: Improved sense of smell and taste. Decreased coughing and sore throat. Slower heart  rate. Lower blood pressure. Clearer and healthier skin. The ability to breathe more easily. Fewer sick  days. Summary Quitting smoking can be very challenging. Do not get discouraged if you are not successful the first time. Some people need to make many attempts to quit before they achieve long-term success. When you decide to quit smoking, create a plan to help you succeed. Quit smoking right away, not slowly over a period of time. Find resources and support systems that can help you quit smoking and remain smoke-free after you quit. This information is not intended to replace advice given to you by your health care provider. Make sure you discuss any questions you have with your health care provider. Document Revised: 10/14/2021 Document Reviewed: 10/14/2021 Elsevier Patient Education  2024 Elsevier Inc.    Signed,   Caro Christmas, MD [facility] 04/21/24 2:10 PM

## 2024-04-22 ENCOUNTER — Ambulatory Visit (INDEPENDENT_AMBULATORY_CARE_PROVIDER_SITE_OTHER)

## 2024-04-22 ENCOUNTER — Ambulatory Visit: Payer: Self-pay | Admitting: Family Medicine

## 2024-04-22 DIAGNOSIS — R109 Unspecified abdominal pain: Secondary | ICD-10-CM

## 2024-04-22 DIAGNOSIS — R112 Nausea with vomiting, unspecified: Secondary | ICD-10-CM | POA: Diagnosis not present

## 2024-04-22 DIAGNOSIS — Z122 Encounter for screening for malignant neoplasm of respiratory organs: Secondary | ICD-10-CM

## 2024-04-22 DIAGNOSIS — Z87891 Personal history of nicotine dependence: Secondary | ICD-10-CM

## 2024-04-22 DIAGNOSIS — F17210 Nicotine dependence, cigarettes, uncomplicated: Secondary | ICD-10-CM

## 2024-04-22 LAB — POCT URINALYSIS DIP (MANUAL ENTRY)
Bilirubin, UA: NEGATIVE
Blood, UA: NEGATIVE
Glucose, UA: NEGATIVE mg/dL
Ketones, POC UA: NEGATIVE mg/dL
Leukocytes, UA: NEGATIVE
Nitrite, UA: NEGATIVE
Protein Ur, POC: NEGATIVE mg/dL
Spec Grav, UA: 1.02 (ref 1.010–1.025)
Urobilinogen, UA: 0.2 U/dL
pH, UA: 6 (ref 5.0–8.0)

## 2024-04-25 ENCOUNTER — Telehealth: Payer: Self-pay

## 2024-04-25 DIAGNOSIS — M2550 Pain in unspecified joint: Secondary | ICD-10-CM

## 2024-04-25 NOTE — Telephone Encounter (Signed)
 Called patient to let her know per Dr.Greene referral for rheumatology was declined but is wondering if she would like a referral placed to orthopedic/hand specialist? Patient VM box was full and unable to leave VM.

## 2024-04-28 ENCOUNTER — Ambulatory Visit (HOSPITAL_BASED_OUTPATIENT_CLINIC_OR_DEPARTMENT_OTHER): Payer: 59 | Admitting: Obstetrics & Gynecology

## 2024-04-28 NOTE — Telephone Encounter (Signed)
 Pt okay with trying this to start but notes she does have pain in her hips and arms and other areas she would like to address at some point in the future

## 2024-04-28 NOTE — Telephone Encounter (Signed)
 I will refer her to ortho, so she can follo wupo with them on other areas as well as time allows.

## 2024-04-29 ENCOUNTER — Other Ambulatory Visit: Payer: Self-pay | Admitting: Acute Care

## 2024-04-29 DIAGNOSIS — F17210 Nicotine dependence, cigarettes, uncomplicated: Secondary | ICD-10-CM

## 2024-04-29 DIAGNOSIS — Z87891 Personal history of nicotine dependence: Secondary | ICD-10-CM

## 2024-04-29 DIAGNOSIS — Z122 Encounter for screening for malignant neoplasm of respiratory organs: Secondary | ICD-10-CM

## 2024-05-15 ENCOUNTER — Ambulatory Visit (HOSPITAL_BASED_OUTPATIENT_CLINIC_OR_DEPARTMENT_OTHER): Admitting: Obstetrics & Gynecology

## 2024-05-15 ENCOUNTER — Encounter (HOSPITAL_BASED_OUTPATIENT_CLINIC_OR_DEPARTMENT_OTHER): Payer: Self-pay | Admitting: Obstetrics & Gynecology

## 2024-05-15 ENCOUNTER — Other Ambulatory Visit (HOSPITAL_COMMUNITY)
Admission: RE | Admit: 2024-05-15 | Discharge: 2024-05-15 | Disposition: A | Discharge status: Home / Self Care | Source: Ambulatory Visit | Attending: Obstetrics & Gynecology | Admitting: Obstetrics & Gynecology

## 2024-05-15 VITALS — BP 116/58 | HR 92 | Wt 164.0 lb

## 2024-05-15 DIAGNOSIS — R232 Flushing: Secondary | ICD-10-CM

## 2024-05-15 DIAGNOSIS — Z8261 Family history of arthritis: Secondary | ICD-10-CM

## 2024-05-15 DIAGNOSIS — M2550 Pain in unspecified joint: Secondary | ICD-10-CM

## 2024-05-15 DIAGNOSIS — L989 Disorder of the skin and subcutaneous tissue, unspecified: Secondary | ICD-10-CM

## 2024-05-15 DIAGNOSIS — Z01411 Encounter for gynecological examination (general) (routine) with abnormal findings: Secondary | ICD-10-CM

## 2024-05-15 DIAGNOSIS — Z124 Encounter for screening for malignant neoplasm of cervix: Secondary | ICD-10-CM | POA: Insufficient documentation

## 2024-05-15 DIAGNOSIS — Z01419 Encounter for gynecological examination (general) (routine) without abnormal findings: Secondary | ICD-10-CM

## 2024-05-15 DIAGNOSIS — F17200 Nicotine dependence, unspecified, uncomplicated: Secondary | ICD-10-CM

## 2024-05-15 MED ORDER — GABAPENTIN 300 MG PO CAPS: ORAL_CAPSULE | ORAL | 12 refills | Status: AC

## 2024-05-15 NOTE — Progress Notes (Signed)
 ANNUAL EXAM Patient name: Carly Wilson MRN [id number]  Date of birth: May 29, 1971 Chief Complaint:   AEX  History of Present Illness:   Carly Wilson is a 53 y.o. G91P1001 Caucasian female being seen today for a routine annual exam.  Denies vaginal bleeding.   She has a lesion on her leg she would like removed.  Will take picture and refer to dermatology.  She is a smoker.  Had screening CT of lungs.  Showed emphysema.  Has seen a pulmonologist this year.    No LMP recorded. (Menstrual status: Perimenopausal).   Last pap 09/06/2021. Results were: NILM w/ HRHPV negative. H/O abnormal pap: yes years ago around age 12 and then in her 29's Last mammogram: 05/15/2023. Results were: normal. Family h/o breast cancer: yes MGM Last colonoscopy: 06/06/2023. Results were: abnormal lymphocytic colitis, follow up 10 years. Family h/o colorectal cancer: yes maternal aunt     04/21/2024    1:07 PM 10/22/2023   10:23 AM 05/01/2023    4:27 PM 04/17/2023    4:13 PM 08/07/2022   11:31 AM  Depression screen PHQ 2/9  Decreased Interest 1 1 0 0 1  Down, Depressed, Hopeless 1 1 0 0 1  PHQ - 2 Score 2 2 0 0 2  Altered sleeping 0 2   0  Tired, decreased energy 1 1   1   Change in appetite 0 0   0  Feeling bad or failure about yourself  0 1   1  Trouble concentrating 0 2   2  Moving slowly or fidgety/restless 0 1   0  Suicidal thoughts 0 0   0  PHQ-9 Score 3 9   6         04/21/2024    1:07 PM 10/22/2023   10:23 AM 08/07/2022   11:32 AM  GAD 7 : Generalized Anxiety Score  Nervous, Anxious, on Edge 3 2 2   Control/stop worrying 1 2 1   Worry too much - different things 1 2 2   Trouble relaxing 0 1 1  Restless 0 1 0  Easily annoyed or irritable 1 3 1   Afraid - awful might happen 1 1 1   Total GAD 7 Score 7 12 8   Anxiety Difficulty Somewhat difficult       Review of Systems:   Pertinent items are noted in HPI Denies any headaches, blurred vision, fatigue, shortness of breath, chest pain,  abdominal pain, abnormal vaginal discharge/itching/odor/irritation, problems with periods, bowel movements, urination, or intercourse unless otherwise stated above. Pertinent History Reviewed:  Reviewed past medical,surgical, social and family history.  Reviewed problem list, medications and allergies. Physical Assessment:   Vitals:   05/15/24 1430  BP: (!) 116/58  Pulse: 92  SpO2: 99%  Weight: 164 lb (74.4 kg)  Body mass index is 27.72 kg/m.        Physical Examination:   General appearance - well appearing, and in no distress  Mental status - alert, oriented to person, place, and time  Psych:  She has a normal mood and affect  Skin - warm and dry, normal color, no suspicious lesions noted  Chest - effort normal, all lung fields clear to auscultation bilaterally  Heart - normal rate and regular rhythm  Neck:  midline trachea, no thyromegaly or nodules  Breasts - breasts appear normal, no suspicious masses, no skin or nipple changes or  axillary nodes  Abdomen - soft, nontender, nondistended, no masses or organomegaly  Pelvic - VULVA: normal appearing  vulva with no masses, tenderness or lesions   VAGINA: normal appearing vagina with normal color and discharge, no lesions   CERVIX: normal appearing cervix without discharge or lesions, no CMT  Thin prep pap is with  HR HPV cotesting  UTERUS: uterus is felt to be normal size, shape, consistency and nontender   ADNEXA: No adnexal masses or tenderness noted.  Rectal - normal rectal, good sphincter tone, no masses felt.  Extremities:  No swelling or varicosities noted     Chaperone present for exam  Assessment & Plan:  1. Well woman exam with routine gynecological exam (Primary) - Pap smear updated today - Mammogram 05/15/2023 - Colonoscopy 06/06/2023 - Bone mineral density not indicated yet - lab work done with PCP, Dr. Levora - vaccines reviewed/updated  2. Leg lesion - Ambulatory referral to Dermatology  3. Hot flashes -  continue with gabapentin  as this has helped - gabapentin  (NEURONTIN ) 300 MG capsule; Take 1 capsule in AM and 3 capsules nightly  Dispense: 120 capsule; Refill: 12  4. Cervical cancer screening - Cytology - PAP( [facility])  5. Smoker   Orders Placed This Encounter  Procedures   Ambulatory referral to Dermatology    Meds:  Meds ordered this encounter  Medications   gabapentin  (NEURONTIN ) 300 MG capsule    Sig: Take 1 capsule in AM and 3 capsules nightly    Dispense:  120 capsule    Refill:  12    Follow-up: Return in about 1 year (around 05/15/2025).  Ronal GORMAN Pinal, MD 05/18/2024 9:39 PM

## 2024-05-18 ENCOUNTER — Encounter (HOSPITAL_BASED_OUTPATIENT_CLINIC_OR_DEPARTMENT_OTHER): Payer: Self-pay | Admitting: Obstetrics & Gynecology

## 2024-05-21 ENCOUNTER — Encounter: Payer: Self-pay | Admitting: Family Medicine

## 2024-05-21 ENCOUNTER — Ambulatory Visit (HOSPITAL_BASED_OUTPATIENT_CLINIC_OR_DEPARTMENT_OTHER): Payer: Self-pay | Admitting: Obstetrics & Gynecology

## 2024-05-21 ENCOUNTER — Ambulatory Visit: Admitting: Family Medicine

## 2024-05-21 VITALS — BP 110/60 | HR 83 | Temp 97.9°F | Ht 64.5 in | Wt 166.0 lb

## 2024-05-21 DIAGNOSIS — R109 Unspecified abdominal pain: Secondary | ICD-10-CM | POA: Diagnosis not present

## 2024-05-21 DIAGNOSIS — M2550 Pain in unspecified joint: Secondary | ICD-10-CM

## 2024-05-21 LAB — CYTOLOGY - PAP
Comment: NEGATIVE
Diagnosis: NEGATIVE
High risk HPV: NEGATIVE

## 2024-05-21 NOTE — Progress Notes (Signed)
 Subjective:  Patient ID: Carly Wilson, female    DOB: 13-Jun-1971  Age: 53 y.o. MRN: [id number]  CC:  Chief Complaint  Patient presents with   Follow-up    On labs and abdominal pain; it comes and goes, feeling okay right now but with pressure still a little sore; vomiting has subsided    HPI Carly Wilson presents for  Follow-up from June 16 visit.  Physical with other concerns discussed at that time.  Abdominal pain, nausea and vomiting, diarrhea Discussed that June 16 visit, intermittent episodes with some improvement from her wrist if no episode at that time and minimal suprapubic discomfort.  Reassuring urinalysis, CBC, CMP, lipase.  History of colitis, recommended coordination of care with her gastroenterologist.  Intermittent upper, lower cramps. No recurrence of nausea/vomiting. 1 day of diarrhea, otherwise loose stools - usual with her hx of lymphocytic colitis.  Some blood in stool with hemorrhoids - some past few days. Cramping about the same. Still taking 9mg  budesonide per day.  No recent dose changes on cymbalta , no heartburn. No n/v recently.  Still taking omeprazole  and pepcid .  No lightheadedness/dizziness. No melena.   See last OV - referred to ortho. They called and she needs to call back for appointment. Some flare of back pains at times. Prior surgery/fusion.   Has appt with pulmonary 8/4.      History Patient Active Problem List   Diagnosis Date Noted   Rectal bleeding 09/08/2021   Hot flashes 09/08/2021   Pain in joint, multiple sites 09/08/2021   Upper airway cough syndrome vs cough variant asthma 05/25/2014   Smoker 05/25/2014   Past Medical History:  Diagnosis Date   Allergy    Anemia    Anxiety    Arthritis    Chronic headaches    Depression    GERD (gastroesophageal reflux disease)    Past Surgical History:  Procedure Laterality Date   APPENDECTOMY  11/06/1988   CARPAL TUNNEL RELEASE Left 1996   wtih ganglion cyst removal    LUMBAR FUSION  2011   MICRODISCECTOMY LUMBAR  2001   Allergies  Allergen Reactions   Sulfa Antibiotics     Nausea    Vicodin [Hydrocodone-Acetaminophen]     nausea   Prior to Admission medications   Medication Sig Start Date End Date Taking? Authorizing Provider  ALPRAZolam  (XANAX ) 1 MG tablet TAKE 1/2 TO 1 (ONE-HALF TO ONE) TABLET BY MOUTH ONCE DAILY AS NEEDED FOR ANXIETY 02/20/24  Yes Levora Reyes SAUNDERS, MD  budesonide (ENTOCORT EC) 3 MG 24 hr capsule Take 9 mg by mouth every morning.   Yes [provider]  DULoxetine  (CYMBALTA ) 30 MG capsule Take 1 capsule (30 mg total) by mouth daily. 04/21/24  Yes Levora Reyes SAUNDERS, MD  famotidine  (PEPCID ) 20 MG tablet Take 1 tablet (20 mg total) by mouth at bedtime. 04/21/24  Yes Levora Reyes SAUNDERS, MD  gabapentin  (NEURONTIN ) 300 MG capsule Take 1 capsule in AM and 3 capsules nightly 05/15/24  Yes Cleotilde Ronal RAMAN, MD  Naproxen Sodium (ALEVE) 220 MG CAPS Take by mouth.   Yes [provider]  omeprazole  (PRILOSEC) 20 MG capsule Take 1 capsule (20 mg total) by mouth daily. 04/21/24  Yes Levora Reyes SAUNDERS, MD  promethazine  (PHENERGAN ) 25 MG tablet Take 1 tablet by mouth every 6 (six) hours as needed. 06/20/19  Yes [provider]  acetaminophen (TYLENOL) 650 MG CR tablet Take 2 tablets by mouth daily. Patient not taking: Reported on  05/21/2024    [provider]   Social History   Socioeconomic History   Marital status: Divorced    Spouse name: Not on file   Number of children: Not on file   Years of education: Not on file   Highest education level: Not on file  Occupational History   Not on file  Tobacco Use   Smoking status: Every Day    Current packs/day: 1.00    Average packs/day: 1 pack/day for 26.0 years (26.0 ttl pk-yrs)    Types: Cigarettes   Smokeless tobacco: Never  Substance and Sexual Activity   Alcohol use: Yes    Comment: 12-42 beers a week   Drug use: Yes    Types: Marijuana   Sexual activity: Yes     Birth control/protection: None  Other Topics Concern   Not on file  Social History Narrative   Not on file   Social Drivers of Health   Financial Resource Strain: Not on file  Food Insecurity: Not on file  Transportation Needs: Not on file  Physical Activity: Not on file  Stress: Not on file  Social Connections: Not on file  Intimate Partner Violence: Not on file    Review of Systems   Objective:   Vitals:   05/21/24 1444  BP: 110/60  Pulse: 83  Temp: 97.9 F (36.6 C)  SpO2: 99%  Weight: 166 lb (75.3 kg)  Height: 5' 4.5 (1.638 m)     Physical Exam Vitals reviewed.  Constitutional:      Appearance: Normal appearance. She is well-developed.  HENT:     Head: Normocephalic and atraumatic.  Eyes:     Conjunctiva/sclera: Conjunctivae normal.     Pupils: Pupils are equal, round, and reactive to light.  Neck:     Vascular: No carotid bruit.  Cardiovascular:     Rate and Rhythm: Normal rate and regular rhythm.     Heart sounds: Normal heart sounds.  Pulmonary:     Effort: Pulmonary effort is normal.     Breath sounds: Normal breath sounds.  Abdominal:     General: There is no distension.     Palpations: Abdomen is soft. There is no pulsatile mass.     Tenderness: There is abdominal tenderness (Slight discomfort with palpation.  Epigastric, diffuse, no rebound or guarding). There is no guarding.  Musculoskeletal:     Right lower leg: No edema.     Left lower leg: No edema.  Skin:    General: Skin is warm and dry.  Neurological:     Mental Status: She is alert and oriented to person, place, and time.  Psychiatric:        Mood and Affect: Mood normal.        Behavior: Behavior normal.     Assessment & Plan:  CAMELLA Wilson is a 53 y.o. female . Polyarthralgia  Abdominal pain, unspecified abdominal location  Orthopedics has contacted her for appointment to discuss polyarthralgia, recommended she call them to follow-up.  Will continue same dose of  Cymbalta  for now given her intestinal/abdominal issues, but if persistent pain and mood symptoms stabilized could consider trial of slightly higher dose depending on evaluation at Ortho.  Regards to her abdominal symptoms, has improved without recurrent nausea or vomiting, some ongoing looser stools, 1 day of true diarrhea.  History of lymphocytic colitis, recommended she contact her gastroenterologist as planned last visit to decide if office visit needed, adjustment of meds or imaging.  RTC/ER precautions  if any acute changes.   No orders of the defined types were placed in this encounter.  Patient Instructions  Plan to hear that some of the symptoms have improved, and no further nausea or vomiting.  With your history of lymphocytic colitis I do recommend calling gastroenterology and letting them know of your symptoms to see if they recommend any changes in your meds, office visit or other imaging.  If any acute worsening symptoms to be seen.  For some of the chronic pain, we could consider higher dose of Cymbalta  but I am concerned that could worsen abdominal symptoms and would want you to discuss those as above first with gastroenterology.  Keep follow-up with Ortho plan, call their office to schedule that appointment.  If any new or worsening symptoms let me know.  Hang in there    Signed,   Reyes [place], MD [facility], [facility] 05/21/24 3:55 PM

## 2024-05-21 NOTE — Patient Instructions (Signed)
 Plan to hear that some of the symptoms have improved, and no further nausea or vomiting.  With your history of lymphocytic colitis I do recommend calling gastroenterology and letting them know of your symptoms to see if they recommend any changes in your meds, office visit or other imaging.  If any acute worsening symptoms to be seen.  For some of the chronic pain, we could consider higher dose of Cymbalta  but I am concerned that could worsen abdominal symptoms and would want you to discuss those as above first with gastroenterology.  Keep follow-up with Ortho plan, call their office to schedule that appointment.  If any new or worsening symptoms let me know.  Hang in there

## 2024-06-09 ENCOUNTER — Encounter: Payer: Self-pay | Admitting: Pulmonary Disease

## 2024-06-09 ENCOUNTER — Ambulatory Visit: Admitting: Pulmonary Disease

## 2024-06-09 VITALS — BP 134/84 | HR 77 | Temp 98.3°F | Ht 64.5 in | Wt 167.4 lb

## 2024-06-09 DIAGNOSIS — J449 Chronic obstructive pulmonary disease, unspecified: Secondary | ICD-10-CM | POA: Diagnosis not present

## 2024-06-09 DIAGNOSIS — F17210 Nicotine dependence, cigarettes, uncomplicated: Secondary | ICD-10-CM

## 2024-06-09 DIAGNOSIS — R0602 Shortness of breath: Secondary | ICD-10-CM | POA: Diagnosis not present

## 2024-06-09 LAB — NITRIC OXIDE: Nitric Oxide: 5

## 2024-06-09 MED ORDER — TRELEGY ELLIPTA 100-62.5-25 MCG/ACT IN AEPB: 1.0000 | INHALATION_SPRAY | Freq: Every day | RESPIRATORY_TRACT | 0 refills | Status: AC

## 2024-06-09 MED ORDER — TRELEGY ELLIPTA 100-62.5-25 MCG/ACT IN AEPB: 1.0000 | INHALATION_SPRAY | Freq: Every day | RESPIRATORY_TRACT | 11 refills | Status: AC

## 2024-06-09 MED ORDER — ALBUTEROL SULFATE HFA 108 (90 BASE) MCG/ACT IN AERS: 2.0000 | INHALATION_SPRAY | Freq: Four times a day (QID) | RESPIRATORY_TRACT | 2 refills | Status: DC | PRN

## 2024-06-09 NOTE — Patient Instructions (Signed)
 VISIT SUMMARY:  Today, we discussed your productive cough and evaluated your lung health. You have been diagnosed with early stage mild emphysema, a type of chronic obstructive pulmonary disease (COPD). We also talked about your long-term smoking habit and its impact on your health.  YOUR PLAN:  -CHRONIC OBSTRUCTIVE PULMONARY DISEASE (COPD) WITH EMPHYSEMA: Emphysema is a type of COPD that damages the air sacs in your lungs, making it hard to breathe. You have early stage mild emphysema. We prescribed a Trelegy inhaler for you to use once daily, and provided a 14-day sample and a coupon to help with the cost. Remember to rinse your mouth after using the inhaler. We also prescribed a rescue inhaler for use when you experience wheezing, cough, or chest tightness. A pulmonary function test will be scheduled at your next visit.  -TOBACCO USE DISORDER: Long-term smoking has significantly impacted your lung health. We strongly advise you to quit smoking to prevent further damage to your lungs. Although you have expressed a desire to quit, you currently have no plans. We encourage you to consider smoking cessation options.  INSTRUCTIONS:  Please use the Trelegy inhaler once daily and rinse your mouth after each use. Use the rescue inhaler as needed for wheezing, cough, or chest tightness. We will schedule a pulmonary function test at your next visit. Consider smoking cessation options to improve your lung health.

## 2024-06-09 NOTE — Progress Notes (Signed)
 Subjective:    Patient ID: Carly Wilson, female    DOB: 05/26/1971, 53 y.o.   MRN: [id number]  Patient Care Team: Levora Reyes SAUNDERS, MD as PCP - General [facility])  Chief Complaint  Patient presents with   Consult    Cough with white to yellow phlegm. Wheezing in the morning only.     BACKGROUND: The patient is a 53 year old current smoker (54.9 PY) who presents for evaluation of cough and wheezing mostly in the mornings.  Recently noted to have emphysema.  Of note the patient was evaluated previously by Dr. Ozell America in 2015.  Patient is referred by the lung cancer screening program for evaluation of very mild emphysema noted on CT.  HPI Discussed the use of AI scribe software for clinical note transcription with the patient, who gave verbal consent to proceed.  History of Present Illness   Carly Wilson is a 53 year old female who presents with a productive cough. She was referred by a lung screening for evaluation of early stage mild emphysema.  Her primary care physician is Dr. Reyes Levora.  She has experienced a productive cough for approximately five to eight years, producing clear to yellow phlegm, primarily in the mornings. She experiences occasional shortness of breath, particularly when climbing stairs or hiking, but continues to engage in these activities.  She has a significant smoking history, having smoked about a pack and a half per day since she was 53 years old. She previously used an inhaler, Dulera, prescribed by Dr. America in 2015, but did not refill the medication due to its cost.  There is a family history of emphysema; her father and grandfather both had the condition both were heavy smokers. Her father also has metastatic bladder cancer.  She lives in a home with significant mold issues, including yellow mold and mildew, which she suspects may contribute to her respiratory symptoms.  She resides in [place], [place]  with her  husband.  She is a Veterinary surgeon by trade.  No prior pulmonary function tests available.    DATA 11/14/2023 LDCT chest: Small pulmonary nodule in the anterior aspect of the left lower lobe, 3.8 mm.  No other pulmonary nodules.  Mild paraseptal and centrilobular emphysema.  Diffuse bronchial wall thickening.  Findings suggestive of underlying COPD.  Review of Systems A 10 point review of systems was performed and it is as noted above otherwise negative.   Past Medical History:  Diagnosis Date   Allergy    Anemia    Anxiety    Arthritis    Chronic headaches    Depression    GERD (gastroesophageal reflux disease)     Past Surgical History:  Procedure Laterality Date   APPENDECTOMY  11/06/1988   CARPAL TUNNEL RELEASE Left 1996   wtih ganglion cyst removal   LUMBAR FUSION  2011   MICRODISCECTOMY LUMBAR  2001    Patient Active Problem List   Diagnosis Date Noted   Rectal bleeding 09/08/2021   Hot flashes 09/08/2021   Pain in joint, multiple sites 09/08/2021   Upper airway cough syndrome vs cough variant asthma 05/25/2014   Smoker 05/25/2014    Family History  Problem Relation Age of Onset   Miscarriages / [place] Mother    Cancer Mother    Arthritis Mother    Allergies Mother    Hypertension Father    Hyperlipidemia Father    Drug abuse Father    COPD Father    Cancer Father  Heart disease Maternal Grandmother    COPD Maternal Grandmother    Breast cancer Maternal Grandmother    COPD Maternal Grandfather    Cancer Maternal Grandfather        bile duct   Rheum arthritis Paternal Grandmother    COPD Paternal Grandfather    Emphysema Paternal Grandfather    Colon cancer Maternal Aunt     Social History   Tobacco Use   Smoking status: Every Day    Current packs/day: 1.50    Average packs/day: 1.5 packs/day for 36.6 years (54.9 ttl pk-yrs)    Types: Cigarettes    Start date: 1989   Smokeless tobacco: Never  Substance Use Topics   Alcohol use: Yes     Comment: 12-42 beers a week    Allergies  Allergen Reactions   Sulfa Antibiotics     Nausea    Vicodin [Hydrocodone-Acetaminophen]     nausea    Current Meds  Medication Sig   acetaminophen (TYLENOL) 650 MG CR tablet Take 2 tablets by mouth daily.   albuterol  (VENTOLIN  HFA) 108 (90 Base) MCG/ACT inhaler Inhale 2 puffs into the lungs every 6 (six) hours as needed.   ALPRAZolam  (XANAX ) 1 MG tablet TAKE 1/2 TO 1 (ONE-HALF TO ONE) TABLET BY MOUTH ONCE DAILY AS NEEDED FOR ANXIETY   budesonide (ENTOCORT EC) 3 MG 24 hr capsule Take 9 mg by mouth every morning.   DULoxetine  (CYMBALTA ) 30 MG capsule Take 1 capsule (30 mg total) by mouth daily.   famotidine  (PEPCID ) 20 MG tablet Take 1 tablet (20 mg total) by mouth at bedtime.   Fluticasone-Umeclidin-Vilant (TRELEGY ELLIPTA ) 100-62.5-25 MCG/ACT AEPB Inhale 1 puff into the lungs daily in the afternoon.   [START ON 06/23/2024] Fluticasone-Umeclidin-Vilant (TRELEGY ELLIPTA ) 100-62.5-25 MCG/ACT AEPB Inhale 1 puff into the lungs daily.   gabapentin  (NEURONTIN ) 300 MG capsule Take 1 capsule in AM and 3 capsules nightly   Naproxen Sodium (ALEVE) 220 MG CAPS Take by mouth.   omeprazole  (PRILOSEC) 20 MG capsule Take 1 capsule (20 mg total) by mouth daily.   promethazine  (PHENERGAN ) 25 MG tablet Take 1 tablet by mouth every 6 (six) hours as needed.    Immunization History  Administered Date(s) Administered   PFIZER(Purple Top)SARS-COV-2 Vaccination 02/13/2020, 03/05/2020   PNEUMOCOCCAL CONJUGATE-20 04/21/2024   Tdap 11/25/2008, 10/22/2023   Zoster Recombinant(Shingrix ) 08/07/2022, 10/22/2023        Objective:     BP 134/84 (BP Location: Left Arm, Patient Position: Sitting, Cuff Size: Normal)   Pulse 77   Temp 98.3 F (36.8 C) (Oral)   Ht 5' 4.5 (1.638 m)   Wt 167 lb 6.4 oz (75.9 kg)   SpO2 98%   BMI 28.29 kg/m   SpO2: 98 %  GENERAL: Well-developed, well-nourished woman, no acute distress, no conversational dyspnea. HEAD:  Normocephalic, atraumatic.  EYES: Pupils equal, round, reactive to light.  No scleral icterus.  MOUTH: Dentition intact, oral mucosa moist.  No thrush. NECK: Supple. No thyromegaly. Trachea midline. No JVD.  No adenopathy. PULMONARY: Good air entry bilaterally.  Coarse, otherwise, no adventitious sounds. CARDIOVASCULAR: S1 and S2. Regular rate and rhythm.  No rubs, murmurs or gallops heard. ABDOMEN: Benign. MUSCULOSKELETAL: No joint deformity, no clubbing, no edema.  NEUROLOGIC: No overt focal deficit, no gait disturbance, speech is fluent. SKIN: Intact,warm,dry. PSYCH: Mood and behavior normal.   Lab Results  Component Value Date   NITRICOXIDE 5 06/09/2024  *No evidence of type II inflammation.   Assessment & Plan:  ICD-10-CM   1. Shortness of breath  R06.02 Nitric oxide     Pulmonary function test    2. COPD suggested by initial evaluation [facility])  J44.9 Pulmonary function test      Orders Placed This Encounter  Procedures   Nitric oxide    Pulmonary function test    Standing Status:   Future    Expiration Date:   06/09/2025    Where should this test be performed?:   Outpatient Pulmonary    What type of PFT is being ordered?:   Full PFT    Meds ordered this encounter  Medications   Fluticasone-Umeclidin-Vilant (TRELEGY ELLIPTA ) 100-62.5-25 MCG/ACT AEPB    Sig: Inhale 1 puff into the lungs daily in the afternoon.    Dispense:  1 each    Refill:  0    Lot Number?:   [id number]    Expiration Date?:   06/06/2025    Manufacturer?:   [employer]Smith[employer] [[id number]]    NDC:   9826-9112-38 [372260]    Quantity:   1   albuterol  (VENTOLIN  HFA) 108 (90 Base) MCG/ACT inhaler    Sig: Inhale 2 puffs into the lungs every 6 (six) hours as needed.    Dispense:  8 g    Refill:  2   Fluticasone-Umeclidin-Vilant (TRELEGY ELLIPTA ) 100-62.5-25 MCG/ACT AEPB    Sig: Inhale 1 puff into the lungs daily.    Dispense:  28 each    Refill:  11   Discussion:    Chronic obstructive pulmonary disease (COPD)  with emphysema Early stage mild emphysema identified on lung screening. Symptoms include productive cough with clear to yellow sputum, occasional dyspnea, and wheezing. Wheezing confirmed on examination. No asthma suggested by low nitric oxide  determination. Symptoms may be exacerbated by mold exposure. Differential diagnosis included asthma, but testing confirmed COPD-related symptoms. - Prescribe Trelegy inhaler, one puff daily. Provide a 14-day sample and a coupon to reduce copay. - Advise rinsing mouth after using Trelegy due to steroid content. - Prescribe a rescue inhaler for use with wheezing, cough, or chest tightness. - Schedule a pulmonary function test at the next visit.  Tobacco use disorder Long-term tobacco use since age 55, currently smoking approximately 1.5 packs per day. Family history of emphysema and cancer. She expressed a desire to quit smoking but has no current plans. - Advise smoking cessation due to early COPD changes.     Will see the patient in 2 months time she is to contact us  prior to that time should any new difficulties arise.   Advised if symptoms do not improve or worsen, to please contact office for sooner follow up or seek emergency care.    I spent 50 minutes of dedicated to the care of this patient on the date of this encounter to include pre-visit review of records, face-to-face time with the patient discussing conditions above, post visit ordering of testing, clinical documentation with the electronic health record, making appropriate referrals as documented, and communicating necessary findings to members of the patients care team.   C. Leita Sanders, MD Advanced Bronchoscopy P[facility]    *This note was dictated using voice recognition software/Dragon.  Despite best efforts to proofread, errors can occur which can change the meaning. Any transcriptional errors that result from this process are unintentional and may not be  fully corrected at the time of dictation.

## 2024-07-23 DIAGNOSIS — M545 Low back pain, unspecified: Secondary | ICD-10-CM | POA: Insufficient documentation

## 2024-07-23 DIAGNOSIS — M5416 Radiculopathy, lumbar region: Secondary | ICD-10-CM | POA: Insufficient documentation

## 2024-07-23 DIAGNOSIS — M47816 Spondylosis without myelopathy or radiculopathy, lumbar region: Secondary | ICD-10-CM | POA: Diagnosis not present

## 2024-08-10 DIAGNOSIS — M5416 Radiculopathy, lumbar region: Secondary | ICD-10-CM | POA: Diagnosis not present

## 2024-08-28 ENCOUNTER — Encounter

## 2024-08-28 ENCOUNTER — Ambulatory Visit: Admitting: Pulmonary Disease

## 2024-09-01 DIAGNOSIS — M47816 Spondylosis without myelopathy or radiculopathy, lumbar region: Secondary | ICD-10-CM | POA: Diagnosis not present

## 2024-09-01 DIAGNOSIS — M5416 Radiculopathy, lumbar region: Secondary | ICD-10-CM | POA: Diagnosis not present

## 2024-10-08 ENCOUNTER — Encounter: Payer: Self-pay | Admitting: Acute Care

## 2024-10-09 ENCOUNTER — Other Ambulatory Visit: Payer: Self-pay | Admitting: Family Medicine

## 2024-10-09 DIAGNOSIS — R058 Other specified cough: Secondary | ICD-10-CM

## 2024-10-09 DIAGNOSIS — K219 Gastro-esophageal reflux disease without esophagitis: Secondary | ICD-10-CM

## 2024-10-21 ENCOUNTER — Other Ambulatory Visit: Payer: Self-pay | Admitting: Pulmonary Disease

## 2024-10-23 ENCOUNTER — Ambulatory Visit: Admitting: Family Medicine

## 2024-11-25 ENCOUNTER — Other Ambulatory Visit: Payer: Self-pay | Admitting: Family Medicine

## 2024-11-25 DIAGNOSIS — G894 Chronic pain syndrome: Secondary | ICD-10-CM

## 2024-11-25 DIAGNOSIS — F418 Other specified anxiety disorders: Secondary | ICD-10-CM

## 2024-12-01 ENCOUNTER — Ambulatory Visit: Admitting: Family Medicine

## 2024-12-11 ENCOUNTER — Ambulatory Visit: Admitting: Family Medicine

## 2024-12-11 ENCOUNTER — Telehealth: Payer: Self-pay

## 2024-12-11 ENCOUNTER — Encounter: Payer: Self-pay | Admitting: Family Medicine

## 2024-12-11 VITALS — BP 108/76 | HR 84 | Temp 98.1°F | Resp 18 | Ht 64.5 in | Wt 156.6 lb

## 2024-12-11 DIAGNOSIS — F418 Other specified anxiety disorders: Secondary | ICD-10-CM

## 2024-12-11 DIAGNOSIS — R0989 Other specified symptoms and signs involving the circulatory and respiratory systems: Secondary | ICD-10-CM

## 2024-12-11 DIAGNOSIS — Z87891 Personal history of nicotine dependence: Secondary | ICD-10-CM

## 2024-12-11 DIAGNOSIS — G894 Chronic pain syndrome: Secondary | ICD-10-CM

## 2024-12-11 MED ORDER — BUSPIRONE HCL 7.5 MG PO TABS: 7.5000 mg | ORAL_TABLET | Freq: Two times a day (BID) | ORAL | 1 refills | Status: AC

## 2024-12-11 NOTE — Telephone Encounter (Signed)
 Patient is interested in getting a CT for lung cancer screening. I see where she has been contacted in the past and a letter was sent. If I can help get her scheduled, please let me know. She is aware that someone will be contacting her.

## 2024-12-11 NOTE — Patient Instructions (Addendum)
 No change in cymbalta  dose for now.  We can try adding buspirone  for anxiety.  I do think that will be helpful.  Start with 1 pill in the morning for the first week, then if tolerated increase to twice per day.  Follow-up in 1 month and we can adjust dosage further at that time if needed.  Follow-up sooner if any new or worsening symptoms. Call your insurance about counselor/therapist in network. I do recommend establishing with a therapist to help guide you through these difficult times, and to assist with managing anxiety along with medications above.We will recheck in 1 month. If not improving with Buspar , will need assistance from psychiatry.  As far as the tramadol , since you are followed by Ortho who is also managing your gabapentin , I would reach out to them first to see if they will refill that medication for intermittent use.  If not, let me know and I could refill that temporarily but then would want to line you up with a pain management provider for ongoing refills if needed.  Again I would start with orthopedics first. For lung cancer screening, it does appear they have tried to contact you to schedule that study.  Please call the number provided on the letter today.  I also recommend contacting [place] [facility] to reschedule the office visit with Dr. Tamea, but let them know that you would like to be seen at the [facility] location if possible. Thank you for coming in today.  We will check blood work at your next visit.  Let me know if there are any questions in the meantime.  Hang in there!

## 2024-12-11 NOTE — Progress Notes (Signed)
 "  Subjective:  Patient ID: Carly Wilson, female    DOB: 02-24-1971  Age: 54 y.o. MRN: [id number]  CC:  Chief Complaint  Patient presents with   Follow-up    5 month follow up visit. Anxiety has gotten worse. Patient has not heard about her lung cancer screening     HPI Carly Wilson presents for   Depression with anxiety, chronic pain syndrome. See previous notes.  Discussed in June and then again in July.  She has been intolerant to higher doses of Cymbalta , remained on 30 mg.  No benefit with addition of Wellbutrin in the past.  Gabapentin  has been used for hot flushes as well as chronic pain, with prior pain management evaluation.  She has been treated with intermittent alprazolam  every week or 2 for flares.  She was referred to psychiatry previously due to intolerances to some medications and continued symptoms.  Have recommended counseling but there was a concern of that being cost prohibitive, recommend she discuss with her insurance.  Unfortunately she has lost their insurance and is unable to see psychiatry at her June visit.  She was feeling better at that time and declined a referral to psychiatry at that time.  Regarding her chronic pain she had been referred to rheumatology previously but unable to go due to losing insurance.  She had been referred to rheumatology by her gynecologist for polyarthralgia.  Normal sed rate ANA and rheumatoid factor per prior evaluation with Dr. Cleotilde.  Pain in hands especially thumb joints discussed at her June 2025 visit, pain in hips and some right knee pain.  Gabapentin  dosing of 300 mg in the morning, 900 mg at night at that time.  Repeat rheumatology referral placed, but referral was denied.  Referred to orthopedics last June.  Ortho had called patient and advised to call back for appointment.  Plan for her to call their office for follow-up. Appears she has been under the care of of Dr. Laqueta with [facility] for lumbar radiculopathy, low  back pain and spondylosis. Leg pain improved. Considering injection.   Has continued Cymbalta  30 mg.  Feels like anxiety symptoms have worsened. Some life stressors past few months. Had to live in camper with foundation in house. 54 year old puppy was hit, had to euthanize in November.  Has difficulty with grieving with this loss. Multiple viral illnesses over the winter. Financial stressors with decreased work, dad has been battling cancer past 2.5 years. Bladder cancer 4 years ago, remission then recurrence with liver metastasis.   Taking cymbalta  30mg  every day.  Alprazolam  every few weeks, Controlled substance database (PDMP) reviewed. No concerns appreciated. Last filled #30 on 02/20/24.   Brother and spouse have taken buspar  - has worked well for brother.   Has taken tramadol  in past for flare of pain in past - has taken only with higher pain flare - about once per month. Takes phenergan  when tramadol  taken.  Taking 2 gapapentin in the morning, 3 at night.       12/11/2024   11:06 AM 05/21/2024    2:54 PM 04/21/2024    1:07 PM 10/22/2023   10:23 AM  GAD 7 : Generalized Anxiety Score  Nervous, Anxious, on Edge 3 1  3  2    Control/stop worrying 2 1  1  2    Worry too much - different things 2 1  1  2    Trouble relaxing 1 0  0  1   Restless 1 0  0  1   Easily annoyed or irritable  0  1  3   Afraid - awful might happen 2 1  1  1    Total GAD 7 Score  4 7 12   Anxiety Difficulty Somewhat difficult Somewhat difficult Somewhat difficult      Data saved with a previous flowsheet row definition      12/11/2024   11:06 AM 05/21/2024    2:54 PM 04/21/2024    1:07 PM 10/22/2023   10:23 AM 05/01/2023    4:27 PM  Depression screen PHQ 2/9  Decreased Interest 1 1 1 1  0  Down, Depressed, Hopeless 1 1 1 1  0  PHQ - 2 Score 2 2 2 2  0  Altered sleeping 1 0 0 2   Tired, decreased energy 1 1 1 1    Change in appetite 0 0 0 0   Feeling bad or failure about yourself  2 1 0 1   Trouble concentrating 0  0 0 2   Moving slowly or fidgety/restless 0 0 0 1   Suicidal thoughts 0 0 0 0   PHQ-9 Score 6 4  3  9     Difficult doing work/chores Somewhat difficult Somewhat difficult        Data saved with a previous flowsheet row definition    COPD, history of tobacco abuse She is evaluated by pulmonary August 4.  Note reviewed.  Pulmonary function testing ordered, nitric oxide , started on Trelegy.  60-month follow-up recommended.  Smoking cessation discussed.  No-show for October appointment. With her tobacco use history lung cancer screening with low-dose CT previously ordered.  Previous imaging 11/14/2023, 1 year repeat planned.  She has not been contacted for that study yet. Advised to contact pulm to schedule appt - she may request to be seen closer.  Took sample of trelegy, but cost - prohibitive to fill rx.   Defers bloodwork to next visit.  Lab Results  Component Value Date   CHOL 217 (H) 04/21/2024   HDL 54.40 04/21/2024   LDLCALC 139 (H) 04/21/2024   TRIG 119.0 04/21/2024   CHOLHDL 4 04/21/2024      History Patient Active Problem List   Diagnosis Date Noted   Lumbar spondylosis 09/01/2024   Low back pain 07/23/2024   Lumbar radiculopathy 07/23/2024   Rectal bleeding 09/08/2021   Hot flashes 09/08/2021   Pain in joint, multiple sites 09/08/2021   Upper airway cough syndrome vs cough variant asthma 05/25/2014   Smoker 05/25/2014   Past Medical History:  Diagnosis Date   Allergy    Anemia    Anxiety    Arthritis    Chronic headaches    Depression    GERD (gastroesophageal reflux disease)    Past Surgical History:  Procedure Laterality Date   APPENDECTOMY  11/06/1988   CARPAL TUNNEL RELEASE Left 1996   wtih ganglion cyst removal   LUMBAR FUSION  2011   MICRODISCECTOMY LUMBAR  2001   Allergies[1] Prior to Admission medications  Medication Sig Start Date End Date Taking? Authorizing Provider  acetaminophen (TYLENOL) 650 MG CR tablet Take 2 tablets by mouth daily.    Yes Provider, Historical, Carly Wilson  albuterol  (VENTOLIN  HFA) 108 (90 Base) MCG/ACT inhaler Inhale 2 puffs into the lungs every 6 (six) hours as needed. Please schedule office visit before any future refills. 10/21/24  Yes Tamea Dedra CROME, Carly Wilson  ALPRAZolam  (XANAX ) 1 MG tablet TAKE 1/2 TO 1 (ONE-HALF TO ONE) TABLET BY MOUTH ONCE DAILY AS  NEEDED FOR ANXIETY 02/20/24  Yes Levora Carly SAUNDERS, Carly Wilson  budesonide (ENTOCORT EC) 3 MG 24 hr capsule Take 9 mg by mouth every morning.   Yes Provider, Historical, Carly Wilson  DULoxetine  (CYMBALTA ) 30 MG capsule Take 1 capsule by mouth once daily 11/25/24  Yes Levora Carly SAUNDERS, Carly Wilson  famotidine  (PEPCID ) 20 MG tablet TAKE 1 TABLET BY MOUTH AT BEDTIME 10/09/24  Yes Levora Carly SAUNDERS, Carly Wilson  gabapentin  (NEURONTIN ) 300 MG capsule Take 1 capsule in AM and 3 capsules nightly 05/15/24  Yes Cleotilde Ronal RAMAN, Carly Wilson  Naproxen Sodium (ALEVE) 220 MG CAPS Take by mouth.   Yes Provider, Historical, Carly Wilson  omeprazole  (PRILOSEC) 20 MG capsule Take 1 capsule (20 mg total) by mouth daily. 04/21/24  Yes Levora Carly SAUNDERS, Carly Wilson  promethazine  (PHENERGAN ) 25 MG tablet Take 1 tablet by mouth every 6 (six) hours as needed. 06/20/19  Yes Provider, Historical, Carly Wilson  Fluticasone-Umeclidin-Vilant (TRELEGY ELLIPTA ) 100-62.5-25 MCG/ACT AEPB Inhale 1 puff into the lungs daily in the afternoon. Patient not taking: Reported on 12/11/2024 06/09/24   Tamea Dedra CROME, Carly Wilson  Fluticasone-Umeclidin-Vilant (TRELEGY ELLIPTA ) 100-62.5-25 MCG/ACT AEPB Inhale 1 puff into the lungs daily. Patient not taking: Reported on 12/11/2024 06/23/24   Tamea Dedra CROME, Carly Wilson   Social History   Socioeconomic History   Marital status: Divorced    Spouse name: Not on file   Number of children: Not on file   Years of education: Not on file   Highest education level: Not on file  Occupational History   Not on file  Tobacco Use   Smoking status: Every Day    Current packs/day: 1.50    Average packs/day: 1.5 packs/day for 37.1 years (55.6 ttl pk-yrs)    Types:  Cigarettes    Start date: 1989   Smokeless tobacco: Never  Substance and Sexual Activity   Alcohol use: Yes    Comment: 12-42 beers a week   Drug use: Yes    Types: Marijuana   Sexual activity: Yes    Birth control/protection: None  Other Topics Concern   Not on file  Social History Narrative   Not on file   Social Drivers of Health   Tobacco Use: High Risk (12/11/2024)   Patient History    Smoking Tobacco Use: Every Day    Smokeless Tobacco Use: Never    Passive Exposure: Not on file  Financial Resource Strain: Not on file  Food Insecurity: Not on file  Transportation Needs: Not on file  Physical Activity: Not on file  Stress: Not on file  Social Connections: Not on file  Intimate Partner Violence: Not on file  Depression (EYV7-0): Medium Risk (12/11/2024)   Depression (PHQ2-9)    PHQ-2 Score: 6  Alcohol Screen: Not on file  Housing: Not on file  Utilities: Not on file  Health Literacy: Not on file    Review of Systems Per HPI.   Objective:   Vitals:   12/11/24 1058  BP: 108/76  Pulse: 84  Resp: 18  Temp: 98.1 F (36.7 C)  TempSrc: Temporal  SpO2: 97%  Weight: 156 lb 9.6 oz (71 kg)  Height: 5' 4.5 (1.638 m)     Physical Exam Vitals reviewed.  Constitutional:      Appearance: Normal appearance. She is well-developed.  HENT:     Head: Normocephalic and atraumatic.  Eyes:     Conjunctiva/sclera: Conjunctivae normal.     Pupils: Pupils are equal, round, and reactive to light.  Neck:  Vascular: No carotid bruit.  Cardiovascular:     Rate and Rhythm: Normal rate and regular rhythm.     Heart sounds: Normal heart sounds.  Pulmonary:     Effort: Pulmonary effort is normal. No respiratory distress.     Breath sounds: Normal breath sounds. No wheezing.  Abdominal:     Palpations: Abdomen is soft. There is no pulsatile mass.     Tenderness: There is no abdominal tenderness.  Musculoskeletal:     Right lower leg: No edema.     Left lower leg: No  edema.  Skin:    General: Skin is warm and dry.  Neurological:     Mental Status: She is alert and oriented to person, place, and time.  Psychiatric:        Mood and Affect: Mood normal.        Behavior: Behavior normal.      I personally spent a total of 57 minutes in the care of the patient today including preparing to see the patient, getting/reviewing separately obtained history, counseling and educating, placing orders, documenting clinical information in the EHR, independently interpreting results, and communicating results.   Assessment & Plan:  YAZMEEN WOOLF is a 54 y.o. female . Depression with anxiety - Plan: busPIRone  (BUSPAR ) 7.5 MG tablet  - Decreased control of anxiety, understandable with multiple stressors as above.  She unfortunately has not tolerated addition of Wellbutrin or higher doses of Cymbalta  in the past, but I think if that buspirone  may be helpful, especially since that has been tolerated by family members.  Will start with 7.5 mg daily for 1 week, then if tolerated increase to twice daily.  1 month follow-up for recheck.  Did recommend she follow-up with therapist as previously recommended, can check with her insurance for coverage to minimize cost impact.  I am happy to provide some numbers if needed.  If BuSpar  is not tolerated or persistent difficulty in treating anxiety, I do recommend follow-up with psychiatry as previously referred.  Can discuss further at follow-up.  COPD suggested by initial evaluation  - Initial evaluation with pulmonary noted, with plan follow-up for pulmonary function testing.  Unfortunately was not able to make that follow-up, stressed importance of calling pulmonary to schedule follow-up appointment and can discuss preferences for local pulmonary provider in [place] if possible.  Can also discuss alternate medication options as Trelegy was cost prohibitive.  Lungs clear on exam today.   Personal history of tobacco use, presenting  hazards to health -- With tobacco use, repeat CT planned for lung cancer screening in January.  It does look like multiple attempts were made to contact patient to schedule, we printed the letter for her today with information to call to schedule.  Chronic pain syndrome  - Gabapentin  now managed by Ortho, treating her back pain.  She does have various arthralgias and recommended discussion of those other areas of pain with Ortho to see if she does need to see a different provider within their office or may need to follow-up with a separate pain management provider.  She plans to discuss with Ortho.  Additionally since they are prescribed her gabapentin  and asked that she discuss a short-term prescription for tramadol  with Ortho first.  If not, I could provide short-term refill as intermittent use, but then would need to refer her to pain management for ongoing treatment.  Understanding of plan expressed.   Meds ordered this encounter  Medications   busPIRone  (BUSPAR ) 7.5 MG tablet  Sig: Take 1 tablet (7.5 mg total) by mouth 2 (two) times daily.    Dispense:  60 tablet    Refill:  1   Patient Instructions  No change in cymbalta  dose for now.  We can try adding buspirone  for anxiety.  I do think that will be helpful.  Start with 1 pill in the morning for the first week, then if tolerated increase to twice per day.  Follow-up in 1 month and we can adjust dosage further at that time if needed.  Follow-up sooner if any new or worsening symptoms. Call your insurance about counselor/therapist in network. I do recommend establishing with a therapist to help guide you through these difficult times, and to assist with managing anxiety along with medications above.We will recheck in 1 month. If not improving with Buspar , will need assistance from psychiatry.  As far as the tramadol , since you are followed by Ortho who is also managing your gabapentin , I would reach out to them first to see if they will  refill that medication for intermittent use.  If not, let me know and I could refill that temporarily but then would want to line you up with a pain management provider for ongoing refills if needed.  Again I would start with orthopedics first. For lung cancer screening, it does appear they have tried to contact you to schedule that study.  Please call the number provided on the letter today.  I also recommend contacting [place] [facility] to reschedule the office visit with Dr. Tamea, but let them know that you would like to be seen at the [facility] location if possible. Thank you for coming in today.  We will check blood work at your next visit.  Let me know if there are any questions in the meantime.  Hang in there!    Signed,   Carly Pines, Carly Wilson [facility], [facility] 12/11/24 12:13 PM      [1]  Allergies Allergen Reactions   Sulfa Antibiotics     Nausea    Vicodin [Hydrocodone-Acetaminophen]     nausea   "

## 2024-12-29 ENCOUNTER — Ambulatory Visit: Admitting: Dermatology

## 2025-01-15 ENCOUNTER — Ambulatory Visit: Admitting: Family Medicine

## 2025-04-22 ENCOUNTER — Encounter: Admitting: Family Medicine
